# Patient Record
Sex: Female | Born: 1940
Health system: Southern US, Community
[De-identification: ages and names within clinical notes are randomized; demographics above are authoritative.]

## PROBLEM LIST (undated history)

## (undated) DIAGNOSIS — N189 Chronic kidney disease, unspecified: Secondary | ICD-10-CM

## (undated) DIAGNOSIS — I259 Chronic ischemic heart disease, unspecified: Secondary | ICD-10-CM

## (undated) DIAGNOSIS — M1990 Unspecified osteoarthritis, unspecified site: Secondary | ICD-10-CM

## (undated) DIAGNOSIS — I10 Essential (primary) hypertension: Secondary | ICD-10-CM

## (undated) DIAGNOSIS — E669 Obesity, unspecified: Secondary | ICD-10-CM

## (undated) DIAGNOSIS — D510 Vitamin B12 deficiency anemia due to intrinsic factor deficiency: Secondary | ICD-10-CM

## (undated) DIAGNOSIS — Z85820 Personal history of malignant melanoma of skin: Secondary | ICD-10-CM

## (undated) DIAGNOSIS — B9681 Helicobacter pylori [H. pylori] as the cause of diseases classified elsewhere: Secondary | ICD-10-CM

## (undated) DIAGNOSIS — B9689 Other specified bacterial agents as the cause of diseases classified elsewhere: Secondary | ICD-10-CM

## (undated) DIAGNOSIS — E1122 Type 2 diabetes mellitus with diabetic chronic kidney disease: Secondary | ICD-10-CM

## (undated) DIAGNOSIS — N250 Renal osteodystrophy: Secondary | ICD-10-CM

## (undated) DIAGNOSIS — K2960 Other gastritis without bleeding: Secondary | ICD-10-CM

## (undated) DIAGNOSIS — E559 Vitamin D deficiency, unspecified: Secondary | ICD-10-CM

## (undated) DIAGNOSIS — E7800 Pure hypercholesterolemia, unspecified: Secondary | ICD-10-CM

## (undated) HISTORY — DX: Vitamin D deficiency, unspecified: E55.9

## (undated) HISTORY — DX: Other gastritis without bleeding: K29.60

## (undated) HISTORY — DX: Obesity, unspecified: E66.9

## (undated) HISTORY — PX: ABDOMINAL HYSTERECTOMY: SHX81

## (undated) HISTORY — DX: Personal history of malignant melanoma of skin: Z85.820

## (undated) HISTORY — PX: BUNIONECTOMY: SHX129

## (undated) HISTORY — DX: Chronic ischemic heart disease, unspecified: I25.9

## (undated) HISTORY — PX: PARTIAL HYSTERECTOMY: SHX80

## (undated) HISTORY — DX: Chronic kidney disease, unspecified: N18.9

## (undated) HISTORY — DX: Helicobacter pylori (H. pylori) as the cause of diseases classified elsewhere: B96.81

## (undated) HISTORY — DX: Other specified bacterial agents as the cause of diseases classified elsewhere: B96.89

## (undated) HISTORY — PX: REPLACEMENT TOTAL KNEE: SUR1224

## (undated) HISTORY — DX: Vitamin B12 deficiency anemia due to intrinsic factor deficiency: D51.0

## (undated) HISTORY — DX: Unspecified osteoarthritis, unspecified site: M19.90

## (undated) HISTORY — PX: CATARACT EXTRACTION: SUR2

## (undated) HISTORY — DX: Pure hypercholesterolemia, unspecified: E78.00

---
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
## 1998-09-09 HISTORY — PX: BYPASS GRAFT: SHX909

## 1998-12-04 ENCOUNTER — Encounter: Payer: Self-pay | Admitting: *Deleted

## 1998-12-04 ENCOUNTER — Ambulatory Visit (HOSPITAL_COMMUNITY): Admission: RE | Admit: 1998-12-04 | Discharge: 1998-12-04 | Payer: Self-pay | Admitting: *Deleted

## 1999-06-29 ENCOUNTER — Inpatient Hospital Stay (HOSPITAL_COMMUNITY): Admission: EM | Admit: 1999-06-29 | Discharge: 1999-07-07 | Payer: Self-pay | Admitting: Cardiology

## 1999-06-30 ENCOUNTER — Encounter: Payer: Self-pay | Admitting: Cardiology

## 1999-07-02 ENCOUNTER — Encounter: Payer: Self-pay | Admitting: Cardiothoracic Surgery

## 1999-07-03 ENCOUNTER — Encounter: Payer: Self-pay | Admitting: Cardiothoracic Surgery

## 1999-07-04 ENCOUNTER — Encounter: Payer: Self-pay | Admitting: Cardiothoracic Surgery

## 1999-07-05 ENCOUNTER — Encounter: Payer: Self-pay | Admitting: Cardiothoracic Surgery

## 2007-02-24 ENCOUNTER — Encounter: Admission: RE | Admit: 2007-02-24 | Discharge: 2007-02-24 | Payer: Self-pay | Admitting: Family Medicine

## 2007-03-02 ENCOUNTER — Encounter: Admission: RE | Admit: 2007-03-02 | Discharge: 2007-03-02 | Payer: Self-pay | Admitting: Diagnostic Radiology

## 2009-09-09 HISTORY — PX: BACK SURGERY: SHX140

## 2010-09-30 ENCOUNTER — Encounter: Payer: Self-pay | Admitting: Diagnostic Radiology

## 2015-10-12 DIAGNOSIS — E109 Type 1 diabetes mellitus without complications: Secondary | ICD-10-CM | POA: Diagnosis not present

## 2015-10-20 DIAGNOSIS — E1165 Type 2 diabetes mellitus with hyperglycemia: Secondary | ICD-10-CM | POA: Diagnosis not present

## 2015-10-25 DIAGNOSIS — E113393 Type 2 diabetes mellitus with moderate nonproliferative diabetic retinopathy without macular edema, bilateral: Secondary | ICD-10-CM | POA: Diagnosis not present

## 2015-10-30 DIAGNOSIS — N189 Chronic kidney disease, unspecified: Secondary | ICD-10-CM | POA: Diagnosis not present

## 2015-10-30 DIAGNOSIS — E1165 Type 2 diabetes mellitus with hyperglycemia: Secondary | ICD-10-CM | POA: Diagnosis not present

## 2015-10-30 DIAGNOSIS — E7800 Pure hypercholesterolemia, unspecified: Secondary | ICD-10-CM | POA: Diagnosis not present

## 2015-10-30 DIAGNOSIS — M1990 Unspecified osteoarthritis, unspecified site: Secondary | ICD-10-CM | POA: Diagnosis not present

## 2015-12-20 DIAGNOSIS — H2512 Age-related nuclear cataract, left eye: Secondary | ICD-10-CM | POA: Diagnosis not present

## 2016-01-01 DIAGNOSIS — Z961 Presence of intraocular lens: Secondary | ICD-10-CM | POA: Diagnosis not present

## 2016-01-01 DIAGNOSIS — H25812 Combined forms of age-related cataract, left eye: Secondary | ICD-10-CM | POA: Diagnosis not present

## 2016-01-01 DIAGNOSIS — E119 Type 2 diabetes mellitus without complications: Secondary | ICD-10-CM | POA: Diagnosis not present

## 2016-01-01 DIAGNOSIS — H2512 Age-related nuclear cataract, left eye: Secondary | ICD-10-CM | POA: Diagnosis not present

## 2016-01-01 DIAGNOSIS — I10 Essential (primary) hypertension: Secondary | ICD-10-CM | POA: Diagnosis not present

## 2016-01-01 DIAGNOSIS — H52202 Unspecified astigmatism, left eye: Secondary | ICD-10-CM | POA: Diagnosis not present

## 2016-01-10 DIAGNOSIS — E113393 Type 2 diabetes mellitus with moderate nonproliferative diabetic retinopathy without macular edema, bilateral: Secondary | ICD-10-CM | POA: Diagnosis not present

## 2016-01-10 DIAGNOSIS — H268 Other specified cataract: Secondary | ICD-10-CM | POA: Diagnosis not present

## 2016-01-31 DIAGNOSIS — E119 Type 2 diabetes mellitus without complications: Secondary | ICD-10-CM | POA: Diagnosis not present

## 2016-01-31 DIAGNOSIS — Z9841 Cataract extraction status, right eye: Secondary | ICD-10-CM | POA: Diagnosis not present

## 2016-01-31 DIAGNOSIS — H25811 Combined forms of age-related cataract, right eye: Secondary | ICD-10-CM | POA: Diagnosis not present

## 2016-01-31 DIAGNOSIS — H2511 Age-related nuclear cataract, right eye: Secondary | ICD-10-CM | POA: Diagnosis not present

## 2016-01-31 DIAGNOSIS — Z9842 Cataract extraction status, left eye: Secondary | ICD-10-CM | POA: Diagnosis not present

## 2016-01-31 DIAGNOSIS — H25011 Cortical age-related cataract, right eye: Secondary | ICD-10-CM | POA: Diagnosis not present

## 2016-01-31 DIAGNOSIS — H25012 Cortical age-related cataract, left eye: Secondary | ICD-10-CM | POA: Diagnosis not present

## 2016-01-31 DIAGNOSIS — I10 Essential (primary) hypertension: Secondary | ICD-10-CM | POA: Diagnosis not present

## 2016-01-31 DIAGNOSIS — Z961 Presence of intraocular lens: Secondary | ICD-10-CM | POA: Diagnosis not present

## 2016-03-06 DIAGNOSIS — E113393 Type 2 diabetes mellitus with moderate nonproliferative diabetic retinopathy without macular edema, bilateral: Secondary | ICD-10-CM | POA: Diagnosis not present

## 2016-03-20 DIAGNOSIS — J019 Acute sinusitis, unspecified: Secondary | ICD-10-CM | POA: Diagnosis not present

## 2016-03-20 DIAGNOSIS — B9689 Other specified bacterial agents as the cause of diseases classified elsewhere: Secondary | ICD-10-CM | POA: Diagnosis not present

## 2016-03-20 DIAGNOSIS — J208 Acute bronchitis due to other specified organisms: Secondary | ICD-10-CM | POA: Diagnosis not present

## 2016-04-03 DIAGNOSIS — J189 Pneumonia, unspecified organism: Secondary | ICD-10-CM | POA: Diagnosis not present

## 2016-04-03 DIAGNOSIS — R05 Cough: Secondary | ICD-10-CM | POA: Diagnosis not present

## 2016-05-02 DIAGNOSIS — Z0000 Encounter for general adult medical examination without abnormal findings: Secondary | ICD-10-CM | POA: Diagnosis not present

## 2016-05-02 DIAGNOSIS — E1165 Type 2 diabetes mellitus with hyperglycemia: Secondary | ICD-10-CM | POA: Diagnosis not present

## 2016-05-02 DIAGNOSIS — Z9181 History of falling: Secondary | ICD-10-CM | POA: Diagnosis not present

## 2016-05-02 DIAGNOSIS — Z1389 Encounter for screening for other disorder: Secondary | ICD-10-CM | POA: Diagnosis not present

## 2016-05-02 DIAGNOSIS — I10 Essential (primary) hypertension: Secondary | ICD-10-CM | POA: Diagnosis not present

## 2016-05-02 DIAGNOSIS — N189 Chronic kidney disease, unspecified: Secondary | ICD-10-CM | POA: Diagnosis not present

## 2016-05-02 DIAGNOSIS — E785 Hyperlipidemia, unspecified: Secondary | ICD-10-CM | POA: Diagnosis not present

## 2016-05-02 DIAGNOSIS — Z780 Asymptomatic menopausal state: Secondary | ICD-10-CM | POA: Diagnosis not present

## 2016-05-17 DIAGNOSIS — I10 Essential (primary) hypertension: Secondary | ICD-10-CM | POA: Diagnosis not present

## 2016-05-22 DIAGNOSIS — M25519 Pain in unspecified shoulder: Secondary | ICD-10-CM | POA: Diagnosis not present

## 2016-06-14 DIAGNOSIS — I10 Essential (primary) hypertension: Secondary | ICD-10-CM | POA: Diagnosis not present

## 2016-06-14 DIAGNOSIS — E119 Type 2 diabetes mellitus without complications: Secondary | ICD-10-CM | POA: Diagnosis not present

## 2016-06-14 DIAGNOSIS — H1045 Other chronic allergic conjunctivitis: Secondary | ICD-10-CM | POA: Diagnosis not present

## 2016-06-14 DIAGNOSIS — Z794 Long term (current) use of insulin: Secondary | ICD-10-CM | POA: Diagnosis not present

## 2016-06-14 DIAGNOSIS — H3561 Retinal hemorrhage, right eye: Secondary | ICD-10-CM | POA: Diagnosis not present

## 2016-06-14 DIAGNOSIS — E113291 Type 2 diabetes mellitus with mild nonproliferative diabetic retinopathy without macular edema, right eye: Secondary | ICD-10-CM | POA: Diagnosis not present

## 2016-06-19 DIAGNOSIS — M25519 Pain in unspecified shoulder: Secondary | ICD-10-CM | POA: Diagnosis not present

## 2016-06-24 DIAGNOSIS — L578 Other skin changes due to chronic exposure to nonionizing radiation: Secondary | ICD-10-CM | POA: Diagnosis not present

## 2016-06-24 DIAGNOSIS — C44319 Basal cell carcinoma of skin of other parts of face: Secondary | ICD-10-CM | POA: Diagnosis not present

## 2016-06-24 DIAGNOSIS — L814 Other melanin hyperpigmentation: Secondary | ICD-10-CM | POA: Diagnosis not present

## 2016-06-24 DIAGNOSIS — D485 Neoplasm of uncertain behavior of skin: Secondary | ICD-10-CM | POA: Diagnosis not present

## 2016-06-25 DIAGNOSIS — C4359 Malignant melanoma of other part of trunk: Secondary | ICD-10-CM | POA: Diagnosis not present

## 2016-07-01 DIAGNOSIS — E1165 Type 2 diabetes mellitus with hyperglycemia: Secondary | ICD-10-CM | POA: Diagnosis not present

## 2016-07-01 DIAGNOSIS — I129 Hypertensive chronic kidney disease with stage 1 through stage 4 chronic kidney disease, or unspecified chronic kidney disease: Secondary | ICD-10-CM | POA: Diagnosis not present

## 2016-07-01 DIAGNOSIS — E1122 Type 2 diabetes mellitus with diabetic chronic kidney disease: Secondary | ICD-10-CM | POA: Diagnosis not present

## 2016-07-01 DIAGNOSIS — N281 Cyst of kidney, acquired: Secondary | ICD-10-CM | POA: Diagnosis not present

## 2016-07-01 DIAGNOSIS — Z23 Encounter for immunization: Secondary | ICD-10-CM | POA: Diagnosis not present

## 2016-07-01 DIAGNOSIS — N250 Renal osteodystrophy: Secondary | ICD-10-CM | POA: Diagnosis not present

## 2016-07-01 DIAGNOSIS — N183 Chronic kidney disease, stage 3 (moderate): Secondary | ICD-10-CM | POA: Diagnosis not present

## 2016-07-03 DIAGNOSIS — I10 Essential (primary) hypertension: Secondary | ICD-10-CM | POA: Diagnosis not present

## 2016-07-03 DIAGNOSIS — E1165 Type 2 diabetes mellitus with hyperglycemia: Secondary | ICD-10-CM | POA: Diagnosis not present

## 2016-07-03 DIAGNOSIS — E7800 Pure hypercholesterolemia, unspecified: Secondary | ICD-10-CM | POA: Diagnosis not present

## 2016-07-04 DIAGNOSIS — E109 Type 1 diabetes mellitus without complications: Secondary | ICD-10-CM | POA: Diagnosis not present

## 2016-07-09 DIAGNOSIS — C4359 Malignant melanoma of other part of trunk: Secondary | ICD-10-CM | POA: Diagnosis not present

## 2016-07-31 DIAGNOSIS — C44319 Basal cell carcinoma of skin of other parts of face: Secondary | ICD-10-CM | POA: Diagnosis not present

## 2016-08-05 DIAGNOSIS — E7800 Pure hypercholesterolemia, unspecified: Secondary | ICD-10-CM | POA: Diagnosis not present

## 2016-08-05 DIAGNOSIS — I10 Essential (primary) hypertension: Secondary | ICD-10-CM | POA: Diagnosis not present

## 2016-08-05 DIAGNOSIS — E1165 Type 2 diabetes mellitus with hyperglycemia: Secondary | ICD-10-CM | POA: Diagnosis not present

## 2016-09-16 DIAGNOSIS — H5203 Hypermetropia, bilateral: Secondary | ICD-10-CM | POA: Diagnosis not present

## 2016-09-16 DIAGNOSIS — Z961 Presence of intraocular lens: Secondary | ICD-10-CM | POA: Diagnosis not present

## 2016-09-16 DIAGNOSIS — H26492 Other secondary cataract, left eye: Secondary | ICD-10-CM | POA: Diagnosis not present

## 2016-09-16 DIAGNOSIS — Z794 Long term (current) use of insulin: Secondary | ICD-10-CM | POA: Diagnosis not present

## 2016-09-16 DIAGNOSIS — E113311 Type 2 diabetes mellitus with moderate nonproliferative diabetic retinopathy with macular edema, right eye: Secondary | ICD-10-CM | POA: Diagnosis not present

## 2016-09-16 DIAGNOSIS — H43813 Vitreous degeneration, bilateral: Secondary | ICD-10-CM | POA: Diagnosis not present

## 2016-09-16 DIAGNOSIS — E119 Type 2 diabetes mellitus without complications: Secondary | ICD-10-CM | POA: Diagnosis not present

## 2016-09-16 DIAGNOSIS — H43393 Other vitreous opacities, bilateral: Secondary | ICD-10-CM | POA: Diagnosis not present

## 2016-09-16 DIAGNOSIS — H52223 Regular astigmatism, bilateral: Secondary | ICD-10-CM | POA: Diagnosis not present

## 2016-09-16 DIAGNOSIS — H26491 Other secondary cataract, right eye: Secondary | ICD-10-CM | POA: Diagnosis not present

## 2016-09-23 DIAGNOSIS — I129 Hypertensive chronic kidney disease with stage 1 through stage 4 chronic kidney disease, or unspecified chronic kidney disease: Secondary | ICD-10-CM | POA: Diagnosis not present

## 2016-09-23 DIAGNOSIS — E1122 Type 2 diabetes mellitus with diabetic chronic kidney disease: Secondary | ICD-10-CM | POA: Diagnosis not present

## 2016-09-23 DIAGNOSIS — R609 Edema, unspecified: Secondary | ICD-10-CM | POA: Diagnosis not present

## 2016-09-23 DIAGNOSIS — N269 Renal sclerosis, unspecified: Secondary | ICD-10-CM | POA: Diagnosis not present

## 2016-09-23 DIAGNOSIS — E1121 Type 2 diabetes mellitus with diabetic nephropathy: Secondary | ICD-10-CM | POA: Diagnosis not present

## 2016-09-23 DIAGNOSIS — N250 Renal osteodystrophy: Secondary | ICD-10-CM | POA: Diagnosis not present

## 2016-09-23 DIAGNOSIS — R809 Proteinuria, unspecified: Secondary | ICD-10-CM | POA: Diagnosis not present

## 2016-09-23 DIAGNOSIS — N183 Chronic kidney disease, stage 3 (moderate): Secondary | ICD-10-CM | POA: Diagnosis not present

## 2016-09-25 DIAGNOSIS — D472 Monoclonal gammopathy: Secondary | ICD-10-CM | POA: Insufficient documentation

## 2016-09-25 HISTORY — DX: Monoclonal gammopathy: D47.2

## 2016-10-09 DIAGNOSIS — E1165 Type 2 diabetes mellitus with hyperglycemia: Secondary | ICD-10-CM | POA: Diagnosis not present

## 2016-10-09 DIAGNOSIS — E559 Vitamin D deficiency, unspecified: Secondary | ICD-10-CM | POA: Diagnosis not present

## 2016-10-09 DIAGNOSIS — E785 Hyperlipidemia, unspecified: Secondary | ICD-10-CM | POA: Diagnosis not present

## 2016-10-09 DIAGNOSIS — Z79899 Other long term (current) drug therapy: Secondary | ICD-10-CM | POA: Diagnosis not present

## 2016-10-09 DIAGNOSIS — Z01419 Encounter for gynecological examination (general) (routine) without abnormal findings: Secondary | ICD-10-CM | POA: Diagnosis not present

## 2016-11-14 DIAGNOSIS — D485 Neoplasm of uncertain behavior of skin: Secondary | ICD-10-CM | POA: Diagnosis not present

## 2016-11-14 DIAGNOSIS — L989 Disorder of the skin and subcutaneous tissue, unspecified: Secondary | ICD-10-CM | POA: Diagnosis not present

## 2016-11-22 DIAGNOSIS — Z1231 Encounter for screening mammogram for malignant neoplasm of breast: Secondary | ICD-10-CM | POA: Diagnosis not present

## 2017-01-02 DIAGNOSIS — D485 Neoplasm of uncertain behavior of skin: Secondary | ICD-10-CM | POA: Diagnosis not present

## 2017-01-31 DIAGNOSIS — E1165 Type 2 diabetes mellitus with hyperglycemia: Secondary | ICD-10-CM | POA: Diagnosis not present

## 2017-02-10 DIAGNOSIS — Z6836 Body mass index (BMI) 36.0-36.9, adult: Secondary | ICD-10-CM | POA: Diagnosis not present

## 2017-02-10 DIAGNOSIS — I10 Essential (primary) hypertension: Secondary | ICD-10-CM | POA: Diagnosis not present

## 2017-02-10 DIAGNOSIS — E7800 Pure hypercholesterolemia, unspecified: Secondary | ICD-10-CM | POA: Diagnosis not present

## 2017-02-10 DIAGNOSIS — E1165 Type 2 diabetes mellitus with hyperglycemia: Secondary | ICD-10-CM | POA: Diagnosis not present

## 2017-03-14 DIAGNOSIS — E109 Type 1 diabetes mellitus without complications: Secondary | ICD-10-CM | POA: Diagnosis not present

## 2017-03-24 DIAGNOSIS — I129 Hypertensive chronic kidney disease with stage 1 through stage 4 chronic kidney disease, or unspecified chronic kidney disease: Secondary | ICD-10-CM | POA: Diagnosis not present

## 2017-03-24 DIAGNOSIS — N183 Chronic kidney disease, stage 3 (moderate): Secondary | ICD-10-CM | POA: Diagnosis not present

## 2017-03-24 DIAGNOSIS — N250 Renal osteodystrophy: Secondary | ICD-10-CM | POA: Diagnosis not present

## 2017-03-24 DIAGNOSIS — Q602 Renal agenesis, unspecified: Secondary | ICD-10-CM | POA: Diagnosis not present

## 2017-03-28 DIAGNOSIS — D472 Monoclonal gammopathy: Secondary | ICD-10-CM | POA: Diagnosis not present

## 2017-04-02 DIAGNOSIS — C44729 Squamous cell carcinoma of skin of left lower limb, including hip: Secondary | ICD-10-CM | POA: Diagnosis not present

## 2017-04-21 DIAGNOSIS — R112 Nausea with vomiting, unspecified: Secondary | ICD-10-CM | POA: Diagnosis not present

## 2017-04-21 DIAGNOSIS — K219 Gastro-esophageal reflux disease without esophagitis: Secondary | ICD-10-CM | POA: Diagnosis not present

## 2017-04-21 DIAGNOSIS — K5730 Diverticulosis of large intestine without perforation or abscess without bleeding: Secondary | ICD-10-CM | POA: Diagnosis not present

## 2017-04-21 DIAGNOSIS — K222 Esophageal obstruction: Secondary | ICD-10-CM | POA: Diagnosis not present

## 2017-05-13 DIAGNOSIS — Z23 Encounter for immunization: Secondary | ICD-10-CM | POA: Diagnosis not present

## 2017-05-14 DIAGNOSIS — Z1389 Encounter for screening for other disorder: Secondary | ICD-10-CM | POA: Diagnosis not present

## 2017-05-14 DIAGNOSIS — E1165 Type 2 diabetes mellitus with hyperglycemia: Secondary | ICD-10-CM | POA: Diagnosis not present

## 2017-05-14 DIAGNOSIS — E7800 Pure hypercholesterolemia, unspecified: Secondary | ICD-10-CM | POA: Diagnosis not present

## 2017-05-14 DIAGNOSIS — Z9181 History of falling: Secondary | ICD-10-CM | POA: Diagnosis not present

## 2017-08-13 DIAGNOSIS — E1165 Type 2 diabetes mellitus with hyperglycemia: Secondary | ICD-10-CM | POA: Diagnosis not present

## 2017-08-20 DIAGNOSIS — I10 Essential (primary) hypertension: Secondary | ICD-10-CM | POA: Diagnosis not present

## 2017-08-20 DIAGNOSIS — E1165 Type 2 diabetes mellitus with hyperglycemia: Secondary | ICD-10-CM | POA: Diagnosis not present

## 2017-08-20 DIAGNOSIS — Z6835 Body mass index (BMI) 35.0-35.9, adult: Secondary | ICD-10-CM | POA: Diagnosis not present

## 2017-08-20 DIAGNOSIS — Z1339 Encounter for screening examination for other mental health and behavioral disorders: Secondary | ICD-10-CM | POA: Diagnosis not present

## 2017-09-29 DIAGNOSIS — Q602 Renal agenesis, unspecified: Secondary | ICD-10-CM | POA: Diagnosis not present

## 2017-09-29 DIAGNOSIS — D472 Monoclonal gammopathy: Secondary | ICD-10-CM | POA: Diagnosis not present

## 2017-09-29 DIAGNOSIS — I129 Hypertensive chronic kidney disease with stage 1 through stage 4 chronic kidney disease, or unspecified chronic kidney disease: Secondary | ICD-10-CM | POA: Diagnosis not present

## 2017-09-29 DIAGNOSIS — N183 Chronic kidney disease, stage 3 (moderate): Secondary | ICD-10-CM | POA: Diagnosis not present

## 2017-09-29 DIAGNOSIS — N250 Renal osteodystrophy: Secondary | ICD-10-CM | POA: Diagnosis not present

## 2017-10-01 DIAGNOSIS — D0471 Carcinoma in situ of skin of right lower limb, including hip: Secondary | ICD-10-CM | POA: Diagnosis not present

## 2017-10-10 DIAGNOSIS — E1165 Type 2 diabetes mellitus with hyperglycemia: Secondary | ICD-10-CM | POA: Diagnosis not present

## 2017-10-16 DIAGNOSIS — D0471 Carcinoma in situ of skin of right lower limb, including hip: Secondary | ICD-10-CM | POA: Diagnosis not present

## 2017-10-22 DIAGNOSIS — Z6835 Body mass index (BMI) 35.0-35.9, adult: Secondary | ICD-10-CM | POA: Diagnosis not present

## 2017-10-22 DIAGNOSIS — D539 Nutritional anemia, unspecified: Secondary | ICD-10-CM | POA: Diagnosis not present

## 2017-10-22 DIAGNOSIS — Z01419 Encounter for gynecological examination (general) (routine) without abnormal findings: Secondary | ICD-10-CM | POA: Diagnosis not present

## 2017-10-22 DIAGNOSIS — E1165 Type 2 diabetes mellitus with hyperglycemia: Secondary | ICD-10-CM | POA: Diagnosis not present

## 2017-10-29 DIAGNOSIS — N183 Chronic kidney disease, stage 3 (moderate): Secondary | ICD-10-CM | POA: Diagnosis not present

## 2017-11-10 DIAGNOSIS — I129 Hypertensive chronic kidney disease with stage 1 through stage 4 chronic kidney disease, or unspecified chronic kidney disease: Secondary | ICD-10-CM | POA: Diagnosis not present

## 2017-11-10 DIAGNOSIS — E1122 Type 2 diabetes mellitus with diabetic chronic kidney disease: Secondary | ICD-10-CM | POA: Diagnosis not present

## 2017-11-10 DIAGNOSIS — N183 Chronic kidney disease, stage 3 (moderate): Secondary | ICD-10-CM | POA: Diagnosis not present

## 2017-11-12 DIAGNOSIS — Z794 Long term (current) use of insulin: Secondary | ICD-10-CM | POA: Diagnosis not present

## 2017-11-12 DIAGNOSIS — H26491 Other secondary cataract, right eye: Secondary | ICD-10-CM | POA: Diagnosis not present

## 2017-11-12 DIAGNOSIS — E113311 Type 2 diabetes mellitus with moderate nonproliferative diabetic retinopathy with macular edema, right eye: Secondary | ICD-10-CM | POA: Diagnosis not present

## 2017-11-12 DIAGNOSIS — E119 Type 2 diabetes mellitus without complications: Secondary | ICD-10-CM | POA: Diagnosis not present

## 2018-01-12 DIAGNOSIS — E1165 Type 2 diabetes mellitus with hyperglycemia: Secondary | ICD-10-CM | POA: Diagnosis not present

## 2018-01-19 DIAGNOSIS — E1165 Type 2 diabetes mellitus with hyperglycemia: Secondary | ICD-10-CM | POA: Diagnosis not present

## 2018-01-19 DIAGNOSIS — I10 Essential (primary) hypertension: Secondary | ICD-10-CM | POA: Diagnosis not present

## 2018-01-19 DIAGNOSIS — E785 Hyperlipidemia, unspecified: Secondary | ICD-10-CM | POA: Diagnosis not present

## 2018-01-28 DIAGNOSIS — E109 Type 1 diabetes mellitus without complications: Secondary | ICD-10-CM | POA: Diagnosis not present

## 2018-02-27 ENCOUNTER — Other Ambulatory Visit: Payer: Self-pay

## 2018-03-02 ENCOUNTER — Other Ambulatory Visit: Payer: Self-pay

## 2018-03-23 DIAGNOSIS — E785 Hyperlipidemia, unspecified: Secondary | ICD-10-CM | POA: Diagnosis not present

## 2018-03-23 DIAGNOSIS — I10 Essential (primary) hypertension: Secondary | ICD-10-CM | POA: Diagnosis not present

## 2018-03-23 DIAGNOSIS — E1165 Type 2 diabetes mellitus with hyperglycemia: Secondary | ICD-10-CM | POA: Diagnosis not present

## 2018-03-23 DIAGNOSIS — Z6835 Body mass index (BMI) 35.0-35.9, adult: Secondary | ICD-10-CM | POA: Diagnosis not present

## 2018-03-24 ENCOUNTER — Ambulatory Visit: Payer: Medicare Other | Admitting: Cardiology

## 2018-04-15 ENCOUNTER — Ambulatory Visit: Payer: Medicare Other | Admitting: Cardiology

## 2018-06-08 DIAGNOSIS — I129 Hypertensive chronic kidney disease with stage 1 through stage 4 chronic kidney disease, or unspecified chronic kidney disease: Secondary | ICD-10-CM | POA: Diagnosis not present

## 2018-06-08 DIAGNOSIS — N183 Chronic kidney disease, stage 3 (moderate): Secondary | ICD-10-CM | POA: Diagnosis not present

## 2018-06-08 DIAGNOSIS — N250 Renal osteodystrophy: Secondary | ICD-10-CM | POA: Diagnosis not present

## 2018-06-12 DIAGNOSIS — Z1331 Encounter for screening for depression: Secondary | ICD-10-CM | POA: Diagnosis not present

## 2018-06-12 DIAGNOSIS — Z9181 History of falling: Secondary | ICD-10-CM | POA: Diagnosis not present

## 2018-06-12 DIAGNOSIS — Z23 Encounter for immunization: Secondary | ICD-10-CM | POA: Diagnosis not present

## 2018-06-12 DIAGNOSIS — E1165 Type 2 diabetes mellitus with hyperglycemia: Secondary | ICD-10-CM | POA: Diagnosis not present

## 2018-09-04 DIAGNOSIS — J0100 Acute maxillary sinusitis, unspecified: Secondary | ICD-10-CM | POA: Diagnosis not present

## 2018-09-04 DIAGNOSIS — R05 Cough: Secondary | ICD-10-CM | POA: Diagnosis not present

## 2018-09-07 DIAGNOSIS — E109 Type 1 diabetes mellitus without complications: Secondary | ICD-10-CM | POA: Diagnosis not present

## 2018-09-11 DIAGNOSIS — Z6834 Body mass index (BMI) 34.0-34.9, adult: Secondary | ICD-10-CM | POA: Diagnosis not present

## 2018-09-11 DIAGNOSIS — E7800 Pure hypercholesterolemia, unspecified: Secondary | ICD-10-CM | POA: Diagnosis not present

## 2018-09-11 DIAGNOSIS — I10 Essential (primary) hypertension: Secondary | ICD-10-CM | POA: Diagnosis not present

## 2018-09-11 DIAGNOSIS — E1165 Type 2 diabetes mellitus with hyperglycemia: Secondary | ICD-10-CM | POA: Diagnosis not present

## 2018-09-29 DIAGNOSIS — N189 Chronic kidney disease, unspecified: Secondary | ICD-10-CM | POA: Diagnosis not present

## 2018-09-29 DIAGNOSIS — D472 Monoclonal gammopathy: Secondary | ICD-10-CM | POA: Diagnosis not present

## 2018-12-01 DIAGNOSIS — N250 Renal osteodystrophy: Secondary | ICD-10-CM | POA: Insufficient documentation

## 2018-12-01 DIAGNOSIS — I10 Essential (primary) hypertension: Secondary | ICD-10-CM

## 2018-12-01 DIAGNOSIS — N183 Chronic kidney disease, stage 3 unspecified: Secondary | ICD-10-CM | POA: Insufficient documentation

## 2018-12-01 DIAGNOSIS — E1122 Type 2 diabetes mellitus with diabetic chronic kidney disease: Secondary | ICD-10-CM

## 2018-12-01 HISTORY — DX: Essential (primary) hypertension: I10

## 2018-12-01 HISTORY — DX: Renal osteodystrophy: N25.0

## 2018-12-01 HISTORY — DX: Chronic kidney disease, stage 3 unspecified: N18.30

## 2018-12-01 HISTORY — DX: Type 2 diabetes mellitus with diabetic chronic kidney disease: E11.22

## 2019-03-16 DIAGNOSIS — Z6834 Body mass index (BMI) 34.0-34.9, adult: Secondary | ICD-10-CM | POA: Diagnosis not present

## 2019-03-16 DIAGNOSIS — N189 Chronic kidney disease, unspecified: Secondary | ICD-10-CM | POA: Diagnosis not present

## 2019-03-16 DIAGNOSIS — E1165 Type 2 diabetes mellitus with hyperglycemia: Secondary | ICD-10-CM | POA: Diagnosis not present

## 2019-03-29 DIAGNOSIS — E109 Type 1 diabetes mellitus without complications: Secondary | ICD-10-CM | POA: Diagnosis not present

## 2019-04-01 DIAGNOSIS — M25511 Pain in right shoulder: Secondary | ICD-10-CM | POA: Diagnosis not present

## 2019-04-08 DIAGNOSIS — M19011 Primary osteoarthritis, right shoulder: Secondary | ICD-10-CM | POA: Diagnosis not present

## 2019-04-08 DIAGNOSIS — M65811 Other synovitis and tenosynovitis, right shoulder: Secondary | ICD-10-CM | POA: Diagnosis not present

## 2019-04-08 DIAGNOSIS — M25511 Pain in right shoulder: Secondary | ICD-10-CM | POA: Diagnosis not present

## 2019-04-14 DIAGNOSIS — M75101 Unspecified rotator cuff tear or rupture of right shoulder, not specified as traumatic: Secondary | ICD-10-CM | POA: Diagnosis not present

## 2019-04-14 DIAGNOSIS — M25511 Pain in right shoulder: Secondary | ICD-10-CM | POA: Diagnosis not present

## 2019-04-26 DIAGNOSIS — M19019 Primary osteoarthritis, unspecified shoulder: Secondary | ICD-10-CM | POA: Diagnosis not present

## 2019-04-26 DIAGNOSIS — M19011 Primary osteoarthritis, right shoulder: Secondary | ICD-10-CM | POA: Diagnosis not present

## 2019-04-26 DIAGNOSIS — M75121 Complete rotator cuff tear or rupture of right shoulder, not specified as traumatic: Secondary | ICD-10-CM | POA: Diagnosis not present

## 2019-04-26 DIAGNOSIS — M25511 Pain in right shoulder: Secondary | ICD-10-CM | POA: Diagnosis not present

## 2019-05-03 DIAGNOSIS — M25511 Pain in right shoulder: Secondary | ICD-10-CM | POA: Diagnosis not present

## 2019-05-03 DIAGNOSIS — R600 Localized edema: Secondary | ICD-10-CM | POA: Diagnosis not present

## 2019-05-03 DIAGNOSIS — Z6834 Body mass index (BMI) 34.0-34.9, adult: Secondary | ICD-10-CM | POA: Diagnosis not present

## 2019-05-03 DIAGNOSIS — L570 Actinic keratosis: Secondary | ICD-10-CM | POA: Diagnosis not present

## 2019-05-10 DIAGNOSIS — M810 Age-related osteoporosis without current pathological fracture: Secondary | ICD-10-CM | POA: Diagnosis not present

## 2019-05-10 DIAGNOSIS — Z01419 Encounter for gynecological examination (general) (routine) without abnormal findings: Secondary | ICD-10-CM | POA: Diagnosis not present

## 2019-05-10 DIAGNOSIS — Z6834 Body mass index (BMI) 34.0-34.9, adult: Secondary | ICD-10-CM | POA: Diagnosis not present

## 2019-05-18 ENCOUNTER — Other Ambulatory Visit: Payer: Self-pay | Admitting: Orthopedic Surgery

## 2019-05-18 DIAGNOSIS — M75121 Complete rotator cuff tear or rupture of right shoulder, not specified as traumatic: Secondary | ICD-10-CM | POA: Diagnosis not present

## 2019-05-18 DIAGNOSIS — M25511 Pain in right shoulder: Secondary | ICD-10-CM | POA: Diagnosis not present

## 2019-05-19 ENCOUNTER — Other Ambulatory Visit: Payer: Self-pay | Admitting: Orthopedic Surgery

## 2019-05-19 DIAGNOSIS — M25511 Pain in right shoulder: Secondary | ICD-10-CM

## 2019-05-20 DIAGNOSIS — D510 Vitamin B12 deficiency anemia due to intrinsic factor deficiency: Secondary | ICD-10-CM | POA: Diagnosis not present

## 2019-05-25 ENCOUNTER — Encounter: Payer: Self-pay | Admitting: Cardiology

## 2019-05-25 ENCOUNTER — Encounter: Payer: Self-pay | Admitting: *Deleted

## 2019-05-25 DIAGNOSIS — E7800 Pure hypercholesterolemia, unspecified: Secondary | ICD-10-CM | POA: Insufficient documentation

## 2019-05-25 DIAGNOSIS — M1990 Unspecified osteoarthritis, unspecified site: Secondary | ICD-10-CM | POA: Insufficient documentation

## 2019-05-25 DIAGNOSIS — E559 Vitamin D deficiency, unspecified: Secondary | ICD-10-CM | POA: Insufficient documentation

## 2019-05-25 DIAGNOSIS — I259 Chronic ischemic heart disease, unspecified: Secondary | ICD-10-CM | POA: Insufficient documentation

## 2019-05-25 DIAGNOSIS — B9689 Other specified bacterial agents as the cause of diseases classified elsewhere: Secondary | ICD-10-CM | POA: Insufficient documentation

## 2019-05-25 DIAGNOSIS — D510 Vitamin B12 deficiency anemia due to intrinsic factor deficiency: Secondary | ICD-10-CM | POA: Insufficient documentation

## 2019-05-25 DIAGNOSIS — Z85820 Personal history of malignant melanoma of skin: Secondary | ICD-10-CM | POA: Insufficient documentation

## 2019-05-25 DIAGNOSIS — N189 Chronic kidney disease, unspecified: Secondary | ICD-10-CM | POA: Insufficient documentation

## 2019-05-25 DIAGNOSIS — B9681 Helicobacter pylori [H. pylori] as the cause of diseases classified elsewhere: Secondary | ICD-10-CM | POA: Insufficient documentation

## 2019-05-25 DIAGNOSIS — E669 Obesity, unspecified: Secondary | ICD-10-CM | POA: Insufficient documentation

## 2019-05-26 ENCOUNTER — Encounter: Payer: Self-pay | Admitting: Cardiology

## 2019-05-26 ENCOUNTER — Other Ambulatory Visit: Payer: Self-pay

## 2019-05-26 ENCOUNTER — Ambulatory Visit (INDEPENDENT_AMBULATORY_CARE_PROVIDER_SITE_OTHER): Payer: Medicare Other | Admitting: Cardiology

## 2019-05-26 VITALS — BP 130/78 | HR 78 | Ht 63.0 in | Wt 192.0 lb

## 2019-05-26 DIAGNOSIS — E7800 Pure hypercholesterolemia, unspecified: Secondary | ICD-10-CM

## 2019-05-26 DIAGNOSIS — Z951 Presence of aortocoronary bypass graft: Secondary | ICD-10-CM

## 2019-05-26 DIAGNOSIS — E088 Diabetes mellitus due to underlying condition with unspecified complications: Secondary | ICD-10-CM

## 2019-05-26 DIAGNOSIS — E119 Type 2 diabetes mellitus without complications: Secondary | ICD-10-CM

## 2019-05-26 DIAGNOSIS — I2510 Atherosclerotic heart disease of native coronary artery without angina pectoris: Secondary | ICD-10-CM

## 2019-05-26 DIAGNOSIS — I10 Essential (primary) hypertension: Secondary | ICD-10-CM | POA: Diagnosis not present

## 2019-05-26 DIAGNOSIS — Z01818 Encounter for other preprocedural examination: Secondary | ICD-10-CM

## 2019-05-26 HISTORY — DX: Type 2 diabetes mellitus without complications: E11.9

## 2019-05-26 HISTORY — DX: Atherosclerotic heart disease of native coronary artery without angina pectoris: I25.10

## 2019-05-26 HISTORY — DX: Diabetes mellitus due to underlying condition with unspecified complications: E08.8

## 2019-05-26 HISTORY — DX: Presence of aortocoronary bypass graft: Z95.1

## 2019-05-26 MED ORDER — NITROGLYCERIN 0.4 MG SL SUBL: 0.4000 mg | SUBLINGUAL_TABLET | SUBLINGUAL | 3 refills | Status: DC | PRN

## 2019-05-26 NOTE — Patient Instructions (Addendum)
Medication Instructions:  Your physician has recommended you make the following change in your medication:   START taking aspirin 81 mg (1 tablet) once daily  START taking nitroglycerin as needed for chest pain, When having chest pain, stop what you are doing and sit down. Take 1 nitro, wait 5 minutes. Still having chest pain, take 1 nitro, wait 5 minutes. Still having chest pain, take 1 nitro, dial 911. Total of 3 nitro in 15 minutes.  If you need a refill on your cardiac medications before your next appointment, please call your pharmacy.   Lab work: Your physician recommends that you return FASTING for lipid and hepatic to be drawn.  If you have labs (blood work) drawn today and your tests are completely normal, you will receive your results only by: Marland Kitchen MyChart Message (if you have MyChart) OR . A paper copy in the mail If you have any lab test that is abnormal or we need to change your treatment, we will call you to review the results.  Testing/Procedures: You had an EKG performed today  Your physician has requested that you have an echocardiogram. Echocardiography is a painless test that uses sound waves to create images of your heart. It provides your doctor with information about the size and shape of your heart and how well your heart's chambers and valves are working. This procedure takes approximately one hour. There are no restrictions for this procedure.    Follow-Up: At [facility], you and your health needs are our priority.  As part of our continuing mission to provide you with exceptional heart care, we have created designated Provider Care Teams.  These Care Teams include your primary Cardiologist (physician) and Advanced Practice Providers (APPs -  Physician Assistants and Nurse Practitioners) who all work together to provide you with the care you need, when you need it. You will need a follow up appointment in 6 months.    Any Other Special Instructions Will Be Listed  Below Nitroglycerin sublingual tablets What is this medicine? NITROGLYCERIN (nye troe GLI ser in) is a type of vasodilator. It relaxes blood vessels, increasing the blood and oxygen supply to your heart. This medicine is used to relieve chest pain caused by angina. It is also used to prevent chest pain before activities like climbing stairs, going outdoors in cold weather, or sexual activity. This medicine may be used for other purposes; ask your health care provider or pharmacist if you have questions. COMMON BRAND NAME(S): Nitroquick, Nitrostat, Nitrotab What should I tell my health care provider before I take this medicine? They need to know if you have any of these conditions:  anemia  head injury, recent stroke, or bleeding in the brain  liver disease  previous heart attack  an unusual or allergic reaction to nitroglycerin, other medicines, foods, dyes, or preservatives  pregnant or trying to get pregnant  breast-feeding How should I use this medicine? Take this medicine by mouth as needed. At the first sign of an angina attack (chest pain or tightness) place one tablet under your tongue. You can also take this medicine 5 to 10 minutes before an event likely to produce chest pain. Follow the directions on the prescription label. Let the tablet dissolve under the tongue. Do not swallow whole. Replace the dose if you accidentally swallow it. It will help if your mouth is not dry. Saliva around the tablet will help it to dissolve more quickly. Do not eat or drink, smoke or chew tobacco while  a tablet is dissolving. If you are not better within 5 minutes after taking ONE dose of nitroglycerin, call 9-1-1 immediately to seek emergency medical care. Do not take more than 3 nitroglycerin tablets over 15 minutes. If you take this medicine often to relieve symptoms of angina, your doctor or health care professional may provide you with different instructions to manage your symptoms. If symptoms  do not go away after following these instructions, it is important to call 9-1-1 immediately. Do not take more than 3 nitroglycerin tablets over 15 minutes. Talk to your pediatrician regarding the use of this medicine in children. Special care may be needed. Overdosage: If you think you have taken too much of this medicine contact a poison control center or emergency room at once. NOTE: This medicine is only for you. Do not share this medicine with others. What if I miss a dose? This does not apply. This medicine is only used as needed. What may interact with this medicine? Do not take this medicine with any of the following medications:  certain migraine medicines like ergotamine and dihydroergotamine (DHE)  medicines used to treat erectile dysfunction like sildenafil, tadalafil, and vardenafil  riociguat This medicine may also interact with the following medications:  alteplase  aspirin  heparin  medicines for high blood pressure  medicines for mental depression  other medicines used to treat angina  phenothiazines like chlorpromazine, mesoridazine, prochlorperazine, thioridazine This list may not describe all possible interactions. Give your health care provider a list of all the medicines, herbs, non-prescription drugs, or dietary supplements you use. Also tell them if you smoke, drink alcohol, or use illegal drugs. Some items may interact with your medicine. What should I watch for while using this medicine? Tell your doctor or health care professional if you feel your medicine is no longer working. Keep this medicine with you at all times. Sit or lie down when you take your medicine to prevent falling if you feel dizzy or faint after using it. Try to remain calm. This will help you to feel better faster. If you feel dizzy, take several deep breaths and lie down with your feet propped up, or bend forward with your head resting between your knees. You may get drowsy or dizzy. Do  not drive, use machinery, or do anything that needs mental alertness until you know how this drug affects you. Do not stand or sit up quickly, especially if you are an older patient. This reduces the risk of dizzy or fainting spells. Alcohol can make you more drowsy and dizzy. Avoid alcoholic drinks. Do not treat yourself for coughs, colds, or pain while you are taking this medicine without asking your doctor or health care professional for advice. Some ingredients may increase your blood pressure. What side effects may I notice from receiving this medicine? Side effects that you should report to your doctor or health care professional as soon as possible:  blurred vision  dry mouth  skin rash  sweating  the feeling of extreme pressure in the head  unusually weak or tired Side effects that usually do not require medical attention (report to your doctor or health care professional if they continue or are bothersome):  flushing of the face or neck  headache  irregular heartbeat, palpitations  nausea, vomiting This list may not describe all possible side effects. Call your doctor for medical advice about side effects. You may report side effects to FDA at 1-800-FDA-1088. Where should I keep my medicine? Keep out  of the reach of children. Store at room temperature between 20 and 25 degrees C (68 and 77 degrees F). Store in [facility] [employer]. Protect from light and moisture. Keep tightly closed. Throw away any unused medicine after the expiration date. NOTE: This sheet is a summary. It may not cover all possible information. If you have questions about this medicine, talk to your doctor, pharmacist, or health care provider.  2020 Elsevier/Gold Standard (2013-06-24 17:57:36)  Aspirin and Your Heart  Aspirin is a medicine that prevents the cells in the blood that are used for clotting, called platelets, from sticking together. Aspirin can be used to help reduce the risk of blood clots,  heart attacks, and other heart-related problems. Can I take aspirin? Your health care provider will help you determine whether it is safe and beneficial for you to take aspirin daily. Taking aspirin daily may be helpful if you:  Have had a heart attack or chest pain.  Are at risk for a heart attack.  Have undergone open-heart surgery, such as coronary artery bypass surgery (CABG).  Have had coronary angioplasty or a stent.  Have had certain types of stroke or transient ischemic attack (TIA).  Have peripheral artery disease (PAD).  Have chronic heart rhythm problems such as atrial fibrillation and cannot take an anticoagulant.  Have valve disease or have had surgery on a valve. What are the risks? Daily use of aspirin can cause side effects. Some of these include:  Bleeding. Bleeding problems can be minor or serious. An example of a minor problem is a cut that does not stop bleeding. An example of a more serious problem is stomach bleeding or, rarely, bleeding into the brain. Your risk of bleeding is increased if you are also taking non-steroidal anti-inflammatory drugs (NSAIDs).  Increased bruising.  Upset stomach.  An allergic reaction. People who have nasal polyps have an increased risk of developing an aspirin allergy. General guidelines  Take aspirin only as told by your health care provider. Make sure that you understand how much you should take and what form you should take. The two forms of aspirin are: ? Non-enteric-coated.This type of aspirin does not have a coating and is absorbed quickly. This type of aspirin also comes in a chewable form. ? Enteric-coated. This type of aspirin has a coating that releases the medicine very slowly. Enteric-coated aspirin might cause less stomach upset than non-enteric-coated aspirin. This type of aspirin should not be chewed or crushed.  Limit alcohol intake to no more than 1 drink a day for nonpregnant women and 2 drinks a day for men.  Drinking alcohol increases your risk of bleeding. One drink equals 12 oz of beer, 5 oz of wine, or 1 oz of hard liquor. Contact a health care provider if you:  Have unusual bleeding or bruising.  Have stomach pain or nausea.  Have ringing in your ears.  Have an allergic reaction that causes: ? Hives. ? Itchy skin. ? Swelling of the lips, tongue, or face. Get help right away if you:  Notice that your bowel movements are bloody, dark red, or black in color.  Vomit or cough up blood.  Have blood in your urine.  Cough, have noisy breathing (wheeze), or feel short of breath.  Have chest pain, especially if the pain spreads to the arms, back, neck, or jaw.  Have a severe headache, or a headache with confusion, or dizziness. These symptoms may represent a serious problem that is an emergency. Do not wait  to see if the symptoms will go away. Get medical help right away. Call your local emergency services (911 in the [place] Do not drive yourself to the hospital. Summary  Aspirin can be used to help reduce the risk of blood clots, heart attacks, and other heart-related problems.  Daily use of aspirin can increase your risk of side effects. Your health care provider will help you determine whether it is safe and beneficial for you to take aspirin daily.  Take aspirin only as told by your health care provider. Make sure that you understand how much you can take and what form you can take. This information is not intended to replace advice given to you by your health care provider. Make sure you discuss any questions you have with your health care provider. Document Released: 08/08/2008 Document Revised: 06/26/2017 Document Reviewed: 06/26/2017 Elsevier Patient Education  [place].  Echocardiogram An echocardiogram is a procedure that uses painless sound waves (ultrasound) to produce an image of the heart. Images from an echocardiogram can provide important information about:   Signs of coronary artery disease (CAD).  Aneurysm detection. An aneurysm is a weak or damaged part of an artery wall that bulges out from the normal force of blood pumping through the body.  Heart size and shape. Changes in the size or shape of the heart can be associated with certain conditions, including heart failure, aneurysm, and CAD.  Heart muscle function.  Heart valve function.  Signs of a past heart attack.  Fluid buildup around the heart.  Thickening of the heart muscle.  A tumor or infectious growth around the heart valves. Tell a health care provider about:  Any allergies you have.  All medicines you are taking, including vitamins, herbs, eye drops, creams, and over-the-counter medicines.  Any blood disorders you have.  Any surgeries you have had.  Any medical conditions you have.  Whether you are pregnant or may be pregnant. What are the risks? Generally, this is a safe procedure. However, problems may occur, including:  Allergic reaction to dye (contrast) that may be used during the procedure. What happens before the procedure? No specific preparation is needed. You may eat and drink normally. What happens during the procedure?   An IV tube may be inserted into one of your veins.  You may receive contrast through this tube. A contrast is an injection that improves the quality of the pictures from your heart.  A gel will be applied to your chest.  A wand-like tool (transducer) will be moved over your chest. The gel will help to transmit the sound waves from the transducer.  The sound waves will harmlessly bounce off of your heart to allow the heart images to be captured in real-time motion. The images will be recorded on a computer. The procedure may vary among health care providers and hospitals. What happens after the procedure?  You may return to your normal, everyday life, including diet, activities, and medicines, unless your health care provider  tells you not to do that. Summary  An echocardiogram is a procedure that uses painless sound waves (ultrasound) to produce an image of the heart.  Images from an echocardiogram can provide important information about the size and shape of your heart, heart muscle function, heart valve function, and fluid buildup around your heart.  You do not need to do anything to prepare before this procedure. You may eat and drink normally.  After the echocardiogram is completed, you may return to  your normal, everyday life, unless your health care provider tells you not to do that. This information is not intended to replace advice given to you by your health care provider. Make sure you discuss any questions you have with your health care provider. Document Released: 08/23/2000 Document Revised: 12/17/2018 Document Reviewed: 09/28/2016 Elsevier Patient Education  2020 [employer].

## 2019-05-26 NOTE — Progress Notes (Signed)
Cardiology Office Note:    Date:  05/26/2019   ID:  Alyssa Cooper, DOB June 27, 1941, MRN [id number]  PCP:  Angelina Sheriff, MD  Cardiologist:  Jenean Lindau, MD   Referring MD: Angelina Sheriff, MD    ASSESSMENT:    1. Pure hypercholesterolemia   2. Essential hypertension   3. Diabetes mellitus due to underlying condition with unspecified complications ([place])   4. Coronary artery disease involving native coronary artery of native heart without angina pectoris   5. Hx of CABG    PLAN:    In order of problems listed above:  1. Coronary artery disease: Secondary prevention stressed with the patient.  Importance of compliance with diet and medication stressed and she vocalized understanding.  Importance of regular exercise stressed and she promises to comply 2. Essential hypertension: Her blood pressure stable and diet was discussed 3. Mixed dyslipidemia: The patient is intolerant to statins.  I will get her back in the next few days for blood work and consider other lipid lowering therapies 4. I have asked her to cut down her aspirin to 81 mg daily. Sublingual nitroglycerin prescription was sent, its protocol and 911 protocol explained and the patient vocalized understanding questions were answered to the patient's satisfaction. 5. Patient will be seen in follow-up appointment in 6 months or earlier if the patient has any concerns    Medication Adjustments/Labs and Tests Ordered: Current medicines are reviewed at length with the patient today.  Concerns regarding medicines are outlined above.  No orders of the defined types were placed in this encounter.  No orders of the defined types were placed in this encounter.    History of Present Illness:    Alyssa Cooper is a 78 y.o. female who is being seen today for the evaluation of coronary artery disease at the request of [place], Angelique Blonder, MD.  This patient has been under my care in my previous practice.  He is  here now to transfer his care and be established with my current practice.  Patient is a pleasant 78 year old female.  She has past medical history of coronary arteries post CABG surgery in the remote past.  She has history of essential hypertension and dyslipidemia.  She is a diabetic.  She mentions to me that she is here for getting be established.  Unfortunately her husband passed away last year and she was spending a lot of time taking care of him.  Currently she is asymptomatic.  At the time of my evaluation, the patient is alert awake oriented and in no distress.  She leads a sedentary lifestyle.  Past Medical History:  Diagnosis Date  . Anemia, pernicious   . Chronic ischemic heart disease   . Chronic kidney disease   . Chronic kidney disease, stage 3 (moderate) ([place]) 12/01/2018  . Helicobacter heilmannii gastritis   . History of melanoma   . Hypertension 12/01/2018  . Obesity   . Osteoarthritis   . Pure hypercholesterolemia   . Renal osteodystrophy 12/01/2018  . Type 2 diabetes mellitus with diabetic chronic kidney disease ([place]) 12/01/2018  . Vitamin D deficiency      Current Medications: Current Meds  Medication Sig  . amLODipine (NORVASC) 5 MG tablet Take 1 tablet by mouth daily.  Marland Kitchen aspirin 325 MG tablet Take 1 tablet by mouth daily.  . carvedilol (COREG) 25 MG tablet Take 1 tablet by mouth daily.  . cloNIDine (CATAPRES) 0.1 MG tablet Take  0.1 mg by mouth at bedtime.  . ergocalciferol (VITAMIN D2) 1.25 MG (50000 UT) capsule Take 50,000 Units by mouth 2 (two) times a week.  . ezetimibe (ZETIA) 10 MG tablet Take 10 mg by mouth daily.  . furosemide (LASIX) 20 MG tablet Take 20 mg by mouth daily.  . insulin glargine (LANTUS) 100 UNIT/ML injection Inject 25 Units into the skin Nightly.  . liraglutide (VICTOZA) 18 MG/3ML SOPN   . quinapril (ACCUPRIL) 40 MG tablet Take 1 tablet by mouth daily.  . rosuvastatin (CRESTOR) 5 MG tablet TK 1 T PO  TWICE WEEKLY  AT NIGHT  ONLY      Allergies:   Penicillins, Amlodipine, Atorvastatin, Crestor [rosuvastatin calcium], Lipitor [atorvastatin calcium], Livalo [pitavastatin], Motrin [ibuprofen], Nsaids, Tramadol, Bactrim [sulfamethoxazole-trimethoprim], and Furosemide   Social History   Socioeconomic History  . Marital status: Married    Spouse name: Not on file  . Number of children: Not on file  . Years of education: Not on file  . Highest education level: Not on file  Occupational History  . Not on file  Social Needs  . Financial resource strain: Not on file  . Food insecurity    Worry: Not on file    Inability: Not on file  . Transportation needs    Medical: Not on file    Non-medical: Not on file  Tobacco Use  . Smoking status: Never Smoker  . Smokeless tobacco: Never Used  Substance and Sexual Activity  . Alcohol use: Never    Frequency: Never  . Drug use: Never  . Sexual activity: Not on file  Lifestyle  . Physical activity    Days per week: Not on file    Minutes per session: Not on file  . Stress: Not on file  Relationships  . Social Herbalist on phone: Not on file    Gets together: Not on file    Attends religious service: Not on file    Active member of club or organization: Not on file    Attends meetings of clubs or organizations: Not on file    Relationship status: Not on file  Other Topics Concern  . Not on file  Social History Narrative  . Not on file     Family History: The patient's family history includes Diabetes in her father and sister; Lung cancer in her brother.  ROS:   Please see the history of present illness.    All other systems reviewed and are negative.  EKGs/Labs/Other Studies Reviewed:    The following studies were reviewed today: EKG reveals sinus rhythm and nonspecific ST-T changes   Recent Labs: No results found for requested labs within last 8760 hours.  Recent Lipid Panel No results found for: CHOL, TRIG, HDL, CHOLHDL, VLDL, LDLCALC,  LDLDIRECT  Physical Exam:    VS:  BP 130/78 (BP Location: Right Arm, Patient Position: Sitting, Cuff Size: Normal)   Pulse 78   Ht 5\' 3"  (1.6 m)   Wt 192 lb (87.1 kg)   SpO2 96%   BMI 34.01 kg/m     Wt Readings from Last 3 Encounters:  05/26/19 192 lb (87.1 kg)     GEN: Patient is in no acute distress HEENT: Normal NECK: No JVD; No carotid bruits LYMPHATICS: No lymphadenopathy CARDIAC: S1 S2 regular, 2/6 systolic murmur at the apex. RESPIRATORY:  Clear to auscultation without rales, wheezing or rhonchi  ABDOMEN: Soft, non-tender, non-distended MUSCULOSKELETAL:  No edema; No deformity  SKIN:  Warm and dry NEUROLOGIC:  Alert and oriented x 3 PSYCHIATRIC:  Normal affect    Signed, Jenean Lindau, MD  05/26/2019 2:01 PM    [place]

## 2019-05-26 NOTE — Addendum Note (Signed)
Addended by: Jyl Heinz R on: 05/26/2019 02:18 PM   Modules accepted: Level of Service

## 2019-05-26 NOTE — Addendum Note (Signed)
Addended by: Beckey Rutter on: 05/26/2019 03:05 PM   Modules accepted: Orders

## 2019-05-26 NOTE — Addendum Note (Signed)
Addended by: Beckey Rutter on: 05/26/2019 02:15 PM   Modules accepted: Orders

## 2019-05-28 ENCOUNTER — Other Ambulatory Visit: Payer: Self-pay

## 2019-05-28 ENCOUNTER — Ambulatory Visit
Admission: RE | Admit: 2019-05-28 | Discharge: 2019-05-28 | Disposition: A | Discharge status: Home / Self Care | Payer: Medicare Other | Source: Ambulatory Visit | Attending: Orthopedic Surgery | Admitting: Orthopedic Surgery

## 2019-05-28 DIAGNOSIS — M25511 Pain in right shoulder: Secondary | ICD-10-CM

## 2019-05-31 ENCOUNTER — Other Ambulatory Visit: Payer: Self-pay

## 2019-05-31 ENCOUNTER — Ambulatory Visit (INDEPENDENT_AMBULATORY_CARE_PROVIDER_SITE_OTHER): Payer: Medicare Other

## 2019-05-31 ENCOUNTER — Telehealth (HOSPITAL_COMMUNITY): Payer: Self-pay | Admitting: *Deleted

## 2019-05-31 DIAGNOSIS — I10 Essential (primary) hypertension: Secondary | ICD-10-CM

## 2019-05-31 NOTE — Progress Notes (Signed)
Complete echocardiogram has been performed.  Jimmy Destry Bezdek RDCS, RVT 

## 2019-05-31 NOTE — Telephone Encounter (Signed)
Patient given detailed instructions per Myocardial Perfusion Study Information Sheet for the test on 06/01/19 at 8:00. Patient notified to arrive 15 minutes early and that it is imperative to arrive on time for appointment to keep from having the test rescheduled.  If you need to cancel or reschedule your appointment, please call the office within 24 hours of your appointment. . Patient verbalized understanding.Veronia Beets

## 2019-06-01 ENCOUNTER — Ambulatory Visit (INDEPENDENT_AMBULATORY_CARE_PROVIDER_SITE_OTHER): Payer: Medicare Other

## 2019-06-01 DIAGNOSIS — I2510 Atherosclerotic heart disease of native coronary artery without angina pectoris: Secondary | ICD-10-CM

## 2019-06-01 DIAGNOSIS — Z01818 Encounter for other preprocedural examination: Secondary | ICD-10-CM

## 2019-06-01 DIAGNOSIS — Z951 Presence of aortocoronary bypass graft: Secondary | ICD-10-CM

## 2019-06-01 MED ORDER — TECHNETIUM TC 99M TETROFOSMIN IV KIT
32.7000 | PACK | Freq: Once | INTRAVENOUS | Status: AC | PRN
  Administered 2019-06-01: 32.7 via INTRAVENOUS

## 2019-06-01 MED ORDER — REGADENOSON 0.4 MG/5ML IV SOLN
0.4000 mg | Freq: Once | INTRAVENOUS | Status: AC
  Administered 2019-06-01: 0.4 mg via INTRAVENOUS

## 2019-06-01 MED ORDER — TECHNETIUM TC 99M TETROFOSMIN IV KIT
11.0000 | PACK | Freq: Once | INTRAVENOUS | Status: AC | PRN
  Administered 2019-06-01: 11 via INTRAVENOUS

## 2019-06-02 LAB — MYOCARDIAL PERFUSION IMAGING
LV dias vol: 73 mL (ref 46–106)
LV sys vol: 22 mL
Peak HR: 83 {beats}/min
Rest HR: 71 {beats}/min
SDS: 1
SRS: 1
SSS: 2
TID: 1.06

## 2019-06-04 ENCOUNTER — Telehealth: Payer: Self-pay

## 2019-06-04 NOTE — Telephone Encounter (Signed)
-----   Message from Jenean Lindau, MD sent at 06/02/2019  9:09 AM EDT ----- Needs appt Jenean Lindau, MD 06/02/2019 9:09 AM

## 2019-06-04 NOTE — Telephone Encounter (Signed)
Information relayed, patient will come into office on 06/11/19 for discussion.

## 2019-06-11 ENCOUNTER — Encounter: Payer: Self-pay | Admitting: Cardiology

## 2019-06-11 ENCOUNTER — Ambulatory Visit (INDEPENDENT_AMBULATORY_CARE_PROVIDER_SITE_OTHER): Payer: Medicare Other | Admitting: Cardiology

## 2019-06-11 ENCOUNTER — Ambulatory Visit: Payer: Medicare Other | Admitting: Cardiology

## 2019-06-11 ENCOUNTER — Other Ambulatory Visit: Payer: Self-pay

## 2019-06-11 VITALS — BP 148/58 | HR 71 | Ht 63.0 in | Wt 194.0 lb

## 2019-06-11 DIAGNOSIS — E7800 Pure hypercholesterolemia, unspecified: Secondary | ICD-10-CM

## 2019-06-11 DIAGNOSIS — I2510 Atherosclerotic heart disease of native coronary artery without angina pectoris: Secondary | ICD-10-CM

## 2019-06-11 DIAGNOSIS — I10 Essential (primary) hypertension: Secondary | ICD-10-CM

## 2019-06-11 DIAGNOSIS — Z951 Presence of aortocoronary bypass graft: Secondary | ICD-10-CM

## 2019-06-11 DIAGNOSIS — R079 Chest pain, unspecified: Secondary | ICD-10-CM

## 2019-06-11 DIAGNOSIS — R931 Abnormal findings on diagnostic imaging of heart and coronary circulation: Secondary | ICD-10-CM

## 2019-06-11 DIAGNOSIS — I259 Chronic ischemic heart disease, unspecified: Secondary | ICD-10-CM

## 2019-06-11 DIAGNOSIS — E088 Diabetes mellitus due to underlying condition with unspecified complications: Secondary | ICD-10-CM

## 2019-06-11 DIAGNOSIS — Z01810 Encounter for preprocedural cardiovascular examination: Secondary | ICD-10-CM

## 2019-06-11 HISTORY — DX: Abnormal findings on diagnostic imaging of heart and coronary circulation: R93.1

## 2019-06-11 HISTORY — DX: Encounter for preprocedural cardiovascular examination: Z01.810

## 2019-06-11 MED ORDER — METOPROLOL TARTRATE 50 MG PO TABS: 100.0000 mg | ORAL_TABLET | Freq: Once | ORAL | 0 refills | Status: DC

## 2019-06-11 NOTE — Progress Notes (Signed)
Cardiology Office Note:    Date:  06/11/2019   ID:  Alyssa Cooper, DOB 1941/09/03, MRN [id number]  PCP:  Angelina Sheriff, MD  Cardiologist:  Jenean Lindau, MD   Referring MD: Angelina Sheriff, MD    ASSESSMENT:    1. Preoperative cardiovascular examination   2. Coronary artery disease involving native coronary artery of native heart without angina pectoris   3. Chronic ischemic heart disease   4. Essential hypertension   5. Diabetes mellitus due to underlying condition with unspecified complications ([place])   6. Hx of CABG   7. Pure hypercholesterolemia   8. Abnormal nuclear cardiac imaging test    PLAN:    In order of problems listed above:  1. Preoperative cardiovascular evaluation: I discussed my findings with the patient at extensive length.  Her surgery is pretty significant and she is 78 year old and has multiple risk factors for coronary artery disease and established disease in view of this I discussed CT coronary angiography and invasive evaluation.  She prefers the former and we will set her up for the same. 2. Essential hypertension: Blood pressure stable 3. Mixed dyslipidemia: I will get a report from primary care physician and review it for optimizing lipid therapy if necessary 4. Patient will be seen in follow-up appointment in 2 months or earlier if the patient has any concerns    Medication Adjustments/Labs and Tests Ordered: Current medicines are reviewed at length with the patient today.  Concerns regarding medicines are outlined above.  No orders of the defined types were placed in this encounter.  No orders of the defined types were placed in this encounter.    Chief Complaint  Patient presents with  . Follow-up    lexiscan     History of Present Illness:    Alyssa Cooper is a 78 y.o. female.  Patient has past medical history of coronary artery disease post CABG surgery, essential hypertension, dyslipidemia and diabetes mellitus.  She is here  for evaluation for preop assessment and a stress test was mildly abnormal.  She leads a sedentary lifestyle.  No chest pain orthopnea or PND.  At the time of my evaluation, the patient is alert awake oriented and in no distress.  Past Medical History:  Diagnosis Date  . Anemia, pernicious   . Chronic ischemic heart disease   . Chronic kidney disease   . Chronic kidney disease, stage 3 (moderate) 12/01/2018  . Helicobacter heilmannii gastritis   . History of melanoma   . Hypertension 12/01/2018  . Obesity   . Osteoarthritis   . Pure hypercholesterolemia   . Renal osteodystrophy 12/01/2018  . Type 2 diabetes mellitus with diabetic chronic kidney disease ([place]) 12/01/2018  . Vitamin D deficiency      Current Medications: Current Meds  Medication Sig  . amLODipine (NORVASC) 5 MG tablet Take 1 tablet by mouth daily.  Marland Kitchen aspirin EC 81 MG tablet Take 81 mg by mouth daily.  . carvedilol (COREG) 25 MG tablet Take 1 tablet by mouth daily.  . cloNIDine (CATAPRES) 0.1 MG tablet Take 0.1 mg by mouth at bedtime.  . ergocalciferol (VITAMIN D2) 1.25 MG (50000 UT) capsule Take 50,000 Units by mouth 2 (two) times a week.  . ezetimibe (ZETIA) 10 MG tablet Take 10 mg by mouth daily.  . furosemide (LASIX) 20 MG tablet Take 20 mg by mouth daily.  . insulin glargine (LANTUS) 100 UNIT/ML injection Inject 25 Units into the skin Nightly.  Marland Kitchen  liraglutide (VICTOZA) 18 MG/3ML SOPN   . nitroGLYCERIN (NITROSTAT) 0.4 MG SL tablet Place 1 tablet (0.4 mg total) under the tongue every 5 (five) minutes as needed.  . quinapril (ACCUPRIL) 40 MG tablet Take 1 tablet by mouth daily.  . rosuvastatin (CRESTOR) 5 MG tablet TK 1 T PO  TWICE WEEKLY  AT NIGHT  ONLY  . [DISCONTINUED] aspirin 325 MG tablet Take 1 tablet by mouth daily.     Allergies:   Penicillins, Amlodipine, Atorvastatin, Crestor [rosuvastatin calcium], Lipitor [atorvastatin calcium], Livalo [pitavastatin], Motrin [ibuprofen], Nsaids, Tramadol, Bactrim  [sulfamethoxazole-trimethoprim], and Furosemide   Social History   Socioeconomic History  . Marital status: Married    Spouse name: Not on file  . Number of children: Not on file  . Years of education: Not on file  . Highest education level: Not on file  Occupational History  . Not on file  Social Needs  . Financial resource strain: Not on file  . Food insecurity    Worry: Not on file    Inability: Not on file  . Transportation needs    Medical: Not on file    Non-medical: Not on file  Tobacco Use  . Smoking status: Never Smoker  . Smokeless tobacco: Never Used  Substance and Sexual Activity  . Alcohol use: Never    Frequency: Never  . Drug use: Never  . Sexual activity: Not on file  Lifestyle  . Physical activity    Days per week: Not on file    Minutes per session: Not on file  . Stress: Not on file  Relationships  . Social Herbalist on phone: Not on file    Gets together: Not on file    Attends religious service: Not on file    Active member of club or organization: Not on file    Attends meetings of clubs or organizations: Not on file    Relationship status: Not on file  Other Topics Concern  . Not on file  Social History Narrative  . Not on file     Family History: The patient's family history includes Diabetes in her father and sister; Lung cancer in her brother.  ROS:   Please see the history of present illness.    All other systems reviewed and are negative.  EKGs/Labs/Other Studies Reviewed:    The following studies were reviewed today:  The left ventricular ejection fraction is hyperdynamic (>65%).  Nuclear stress EF: 70%.  There was no ST segment deviation noted during stress.  Defect 1: There is a small defect of mild severity present in the mid anteroseptal location.  This is an intermediate risk study.  The area of perfusion defect is small in the anteroseptal wall. The small mild defect is below the RV insertion point.   Findings consistent with ischemia.   Recent Labs: No results found for requested labs within last 8760 hours.  Recent Lipid Panel No results found for: CHOL, TRIG, HDL, CHOLHDL, VLDL, LDLCALC, LDLDIRECT  Physical Exam:    VS:  BP (!) 148/58 (BP Location: Left Arm, Patient Position: Sitting, Cuff Size: Normal)   Pulse 71   Ht 5\' 3"  (1.6 m)   Wt 194 lb (88 kg)   SpO2 97%   BMI 34.37 kg/m     Wt Readings from Last 3 Encounters:  06/11/19 194 lb (88 kg)  06/01/19 192 lb (87.1 kg)  05/26/19 192 lb (87.1 kg)     GEN: Patient is in  no acute distress HEENT: Normal NECK: No JVD; No carotid bruits LYMPHATICS: No lymphadenopathy CARDIAC: Hear sounds regular, 2/6 systolic murmur at the apex. RESPIRATORY:  Clear to auscultation without rales, wheezing or rhonchi  ABDOMEN: Soft, non-tender, non-distended MUSCULOSKELETAL:  No edema; No deformity  SKIN: Warm and dry NEUROLOGIC:  Alert and oriented x 3 PSYCHIATRIC:  Normal affect   Signed, Jenean Lindau, MD  06/11/2019 11:50 AM    [place]

## 2019-06-11 NOTE — Patient Instructions (Addendum)
Medication Instructions:  Your physician recommends that you continue on your current medications as directed. Please refer to the Current Medication list given to you today.  If you need a refill on your cardiac medications before your next appointment, please call your pharmacy.   Lab work: You will need to come in 7 days prior to procedure to have a BMP drawn.  If you have labs (blood work) drawn today and your tests are completely normal, you will receive your results only by:  [place] (if you have MyChart) OR  A paper copy in the mail If you have any lab test that is abnormal or we need to change your treatment, we will call you to review the results.  Testing/Procedures: Non-Cardiac CT scanning, (CAT scanning), is a noninvasive, special x-ray that produces cross-sectional images of the body using x-rays and a computer. CT scans help physicians diagnose and treat medical conditions. For some CT exams, a contrast material is used to enhance visibility in the area of the body being studied. CT scans provide greater clarity and reveal more details than regular x-ray exams.  Please arrive at the [facility] main entrance of [facility] at xx:xx AM (30-45 minutes prior to test start time)  [facility] [place], [place] [address] [phone]  Proceed to the [facility] Radiology Department (First Floor).  Please follow these instructions carefully (unless otherwise directed):  On the Night Before the Test:  Be sure to Drink plenty of water.  Do not consume any caffeinated/decaffeinated beverages or chocolate 12 hours prior to your test.  Do not take any antihistamines 12 hours prior to your test.  TAKE half dose of lantus before bed  On the Day of the Test:  Drink plenty of water. Do not drink any water within one hour of the test.  Do not eat any food 4 hours prior to the test.  You may take your regular medications prior to the  test.   Take metoprolol (Lopressor) two hours prior to test if HR is 55 BPM or greater  HOLD Furosemide morning of the test.      After the Test:  Drink plenty of water.  After receiving IV contrast, you may experience a mild flushed feeling. This is normal.  On occasion, you may experience a mild rash up to 24 hours after the test. This is not dangerous. If this occurs, you can take Benadryl 25 mg and increase your fluid intake.  If you experience trouble breathing, this can be serious. If it is severe call 911 IMMEDIATELY. If it is mild, please call our office.  If you take any of these medications: Glipizide/Metformin, Avandament, Glucavance, please do not take 48 hours after completing test.   Follow-Up: At [facility], you and your health needs are our priority.  As part of our continuing mission to provide you with exceptional heart care, we have created designated Provider Care Teams.  These Care Teams include your primary Cardiologist (physician) and Advanced Practice Providers (APPs -  Physician Assistants and Nurse Practitioners) who all work together to provide you with the care you need, when you need it. You will need a follow up appointment in 1 months.    Any Other Special Instructions Will Be Listed Below  Metoprolol tablets What is this medicine? METOPROLOL (me TOE proe lole) is a beta-blocker. Beta-blockers reduce the workload on the heart and help it to beat more regularly. This medicine is used  to treat high blood pressure and to prevent chest pain. It is also used to after a heart attack and to prevent an additional heart attack from occurring. This medicine may be used for other purposes; ask your health care provider or pharmacist if you have questions. COMMON BRAND NAME(S): Lopressor What should I tell my health care provider before I take this medicine? They need to know if you have any of these conditions: -diabetes -heart or vessel disease like slow heart  rate, worsening heart failure, heart block, sick sinus syndrome or Raynaud's disease -kidney disease -liver disease -lung or breathing disease, like asthma or emphysema -pheochromocytoma -thyroid disease -an unusual or allergic reaction to metoprolol, other beta-blockers, medicines, foods, dyes, or preservatives -pregnant or trying to get pregnant -breast-feeding How should I use this medicine? Take this medicine by mouth with a drink of water. Follow the directions on the prescription label. Take this medicine immediately after meals. Take your doses at regular intervals. Do not take more medicine than directed. Do not stop taking this medicine suddenly. This could lead to serious heart-related effects. Talk to your pediatrician regarding the use of this medicine in children. Special care may be needed. Overdosage: If you think you have taken too much of this medicine contact a poison control center or emergency room at once. NOTE: This medicine is only for you. Do not share this medicine with others. What if I miss a dose? If you miss a dose, take it as soon as you can. If it is almost time for your next dose, take only that dose. Do not take double or extra doses. What may interact with this medicine? This medicine may interact with the following medications: -certain medicines for blood pressure, heart disease, irregular heart beat -certain medicines for depression like monoamine oxidase (MAO) inhibitors, fluoxetine, or paroxetine -clonidine -dobutamine -epinephrine -isoproterenol -reserpine This list may not describe all possible interactions. Give your health care provider a list of all the medicines, herbs, non-prescription drugs, or dietary supplements you use. Also tell them if you smoke, drink alcohol, or use illegal drugs. Some items may interact with your medicine. What should I watch for while using this medicine? Visit your doctor or health care professional for regular check  ups. Contact your doctor right away if your symptoms worsen. Check your blood pressure and pulse rate regularly. Ask your health care professional what your blood pressure and pulse rate should be, and when you should contact them. You may get drowsy or dizzy. Do not drive, use machinery, or do anything that needs mental alertness until you know how this medicine affects you. Do not sit or stand up quickly, especially if you are an older patient. This reduces the risk of dizzy or fainting spells. Contact your doctor if these symptoms continue. Alcohol may interfere with the effect of this medicine. Avoid alcoholic drinks. What side effects may I notice from receiving this medicine? Side effects that you should report to your doctor or health care professional as soon as possible: -allergic reactions like skin rash, itching or hives -cold or numb hands or feet -depression -difficulty breathing -faint -fever with sore throat -irregular heartbeat, chest pain -rapid weight gain -swollen legs or ankles Side effects that usually do not require medical attention (report to your doctor or health care professional if they continue or are bothersome): -anxiety or nervousness -change in sex drive or performance -dry skin -headache -nightmares or trouble sleeping -short term memory loss -stomach upset or diarrhea -unusually  tired This list may not describe all possible side effects. Call your doctor for medical advice about side effects. You may report side effects to FDA at 1-800-FDA-1088. Where should I keep my medicine? Keep out of the reach of children. Store at room temperature between 15 and 30 degrees C (59 and 86 degrees F). Throw away any unused medicine after the expiration date. NOTE: This sheet is a summary. It may not cover all possible information. If you have questions about this medicine, talk to your doctor, pharmacist, or health care provider.  2019 Elsevier/Gold Standard  (2013-04-30 14:40:36)   Coronary Angiogram A coronary angiogram is an X-ray procedure that is used to examine the arteries in the heart. In this procedure, a dye (contrast dye) is injected through a long, thin tube (catheter). The catheter is inserted through the groin, wrist, or arm. The dye is injected into each artery, then X-rays are taken to show if there is a blockage in the arteries of the heart. This procedure can also show if you have valve disease or a disease of the aorta, and it can be used to check the overall function of your heart muscle. You may have a coronary angiogram if:  You are having chest pain, or other symptoms of angina, and you are at risk for heart disease.  You have an abnormal electrocardiogram (ECG) or stress test.  You have chest pain and heart failure.  You are having irregular heart rhythms.  You and your health care provider determine that the benefits of the test information outweigh the risks of the procedure. Let your health care provider know about:  Any allergies you have, including allergies to contrast dye.  All medicines you are taking, including vitamins, herbs, eye drops, creams, and over-the-counter medicines.  Any problems you or family members have had with anesthetic medicines.  Any blood disorders you have.  Any surgeries you have had.  History of kidney problems or kidney failure.  Any medical conditions you have.  Whether you are pregnant or may be pregnant. What are the risks? Generally, this is a safe procedure. However, problems may occur, including:  Infection.  Allergic reaction to medicines or dyes that are used.  Bleeding from the access site or other locations.  Kidney injury, especially in people with impaired kidney function.  Stroke (rare).  Heart attack (rare).  Damage to other structures or organs. What happens before the procedure? Staying hydrated Follow instructions from your health care provider  about hydration, which may include:  Up to 2 hours before the procedure - you may continue to drink clear liquids, such as water, clear fruit juice, black coffee, and plain tea. Eating and drinking restrictions Follow instructions from your health care provider about eating and drinking, which may include:  8 hours before the procedure - stop eating heavy meals or foods such as meat, fried foods, or fatty foods.  6 hours before the procedure - stop eating light meals or foods, such as toast or cereal.  2 hours before the procedure - stop drinking clear liquids. General instructions  Ask your health care provider about: ? Changing or stopping your regular medicines. This is especially important if you are taking diabetes medicines or blood thinners. ? Taking medicines such as ibuprofen. These medicines can thin your blood. Do not take these medicines before your procedure if your health care provider instructs you not to, though aspirin may be recommended prior to coronary angiograms.  Plan to have someone take  you home from the hospital or clinic.  You may need to have blood tests or X-rays done. What happens during the procedure?  An IV tube will be inserted into one of your veins.  You will be given one or more of the following: ? A medicine to help you relax (sedative). ? A medicine to numb the area where the catheter will be inserted into an artery (local anesthetic).  To reduce your risk of infection: ? Your health care team will wash or sanitize their hands. ? Your skin will be washed with soap. ? Hair may be removed from the area where the catheter will be inserted.  You will be connected to a continuous ECG monitor.  The catheter will be inserted into an artery. The location may be in your groin, in your wrist, or in the fold of your arm (near your elbow).  A type of X-ray (fluoroscopy) will be used to help guide the catheter to the opening of the blood vessel that is  being examined.  A dye will be injected into the catheter, and X-rays will be taken. The dye will help to show where any narrowing or blockages are located in the heart arteries.  Tell your health care provider if you have any chest pain or trouble breathing during the procedure.  If blockages are found, your health care provider may perform another procedure, such as inserting a coronary stent. The procedure may vary among health care providers and hospitals. What happens after the procedure?  After the procedure, you will need to keep the area still for a few hours, or for as long as told by your health care provider. If the procedure is done through the groin, you will be instructed to not bend and not cross your legs.  The insertion site will be checked frequently.  The pulse in your foot or wrist will be checked frequently.  You may have additional blood tests, X-rays, and a test that records the electrical activity of your heart (ECG).  Do not drive for 24 hours if you were given a sedative. Summary  A coronary angiogram is an X-ray procedure that is used to look into the arteries in the heart.  During the procedure, a dye (contrast dye) is injected through a long, thin tube (catheter). The catheter is inserted through the groin, wrist, or arm.  Tell your health care provider about any allergies you have, including allergies to contrast dye.  After the procedure, you will need to keep the area still for a few hours, or for as long as told by your health care provider. This information is not intended to replace advice given to you by your health care provider. Make sure you discuss any questions you have with your health care provider. Document Released: 03/02/2003 Document Revised: 06/07/2016 Document Reviewed: 06/07/2016 Elsevier Interactive Patient Education  2019 [employer].

## 2019-06-23 ENCOUNTER — Telehealth: Payer: Self-pay | Admitting: Cardiology

## 2019-06-23 DIAGNOSIS — E113291 Type 2 diabetes mellitus with mild nonproliferative diabetic retinopathy without macular edema, right eye: Secondary | ICD-10-CM | POA: Diagnosis not present

## 2019-06-23 DIAGNOSIS — E119 Type 2 diabetes mellitus without complications: Secondary | ICD-10-CM | POA: Diagnosis not present

## 2019-06-23 DIAGNOSIS — Z961 Presence of intraocular lens: Secondary | ICD-10-CM | POA: Diagnosis not present

## 2019-06-23 DIAGNOSIS — H35041 Retinal micro-aneurysms, unspecified, right eye: Secondary | ICD-10-CM | POA: Diagnosis not present

## 2019-06-23 DIAGNOSIS — E109 Type 1 diabetes mellitus without complications: Secondary | ICD-10-CM | POA: Diagnosis not present

## 2019-06-23 NOTE — Telephone Encounter (Signed)
Wants to know why the surgery RRR scheduled her for was cancelled

## 2019-06-23 NOTE — Telephone Encounter (Signed)
Left message for patient to call back with more info about cancelled appt.

## 2019-06-24 NOTE — Telephone Encounter (Signed)
Spoke with patients grand-daughter Alyssa Cooper and unsure of what information was accurate.Patient states she was contacted by [facility] and told her procedure was cancelled. There are no notes in system that CT/FFR was even scheduled. Note sent to precert/scheduling to verify.

## 2019-06-28 ENCOUNTER — Other Ambulatory Visit: Payer: Medicare Other

## 2019-06-28 ENCOUNTER — Telehealth: Payer: Self-pay

## 2019-06-28 NOTE — Telephone Encounter (Signed)
Patients DPR Alyssa Cooper called again wanting to schedule CT FFR due to pending surgery. RN skyped A. Marlene Lard and S.Glo Herring to advise patient is calling again.

## 2019-06-29 ENCOUNTER — Telehealth: Payer: Self-pay | Admitting: Cardiology

## 2019-06-29 NOTE — Telephone Encounter (Signed)
Please call patient about her getting scheduled for CTA at [facility] and she is scheduled for 11/4 and she states that she needs labwork. Please call her

## 2019-06-29 NOTE — Telephone Encounter (Signed)
Called patient and reviewed lab schedule, patient will come by fasting one day next week. No further questions.

## 2019-07-06 DIAGNOSIS — I10 Essential (primary) hypertension: Secondary | ICD-10-CM | POA: Diagnosis not present

## 2019-07-06 DIAGNOSIS — Z01810 Encounter for preprocedural cardiovascular examination: Secondary | ICD-10-CM | POA: Diagnosis not present

## 2019-07-06 DIAGNOSIS — I2510 Atherosclerotic heart disease of native coronary artery without angina pectoris: Secondary | ICD-10-CM | POA: Diagnosis not present

## 2019-07-06 DIAGNOSIS — I259 Chronic ischemic heart disease, unspecified: Secondary | ICD-10-CM | POA: Diagnosis not present

## 2019-07-07 LAB — BASIC METABOLIC PANEL
BUN/Creatinine Ratio: 14 (ref 12–28)
BUN: 17 mg/dL (ref 8–27)
CO2: 24 mmol/L (ref 20–29)
Calcium: 9.9 mg/dL (ref 8.7–10.3)
Chloride: 108 mmol/L — ABNORMAL HIGH (ref 96–106)
Creatinine, Ser: 1.18 mg/dL — ABNORMAL HIGH (ref 0.57–1.00)
GFR calc Af Amer: 50 mL/min/{1.73_m2} — ABNORMAL LOW (ref >59)
GFR calc non Af Amer: 44 mL/min/{1.73_m2} — ABNORMAL LOW (ref >59)
Glucose: 133 mg/dL — ABNORMAL HIGH (ref 65–99)
Potassium: 4.4 mmol/L (ref 3.5–5.2)
Sodium: 144 mmol/L (ref 134–144)

## 2019-07-08 ENCOUNTER — Telehealth: Payer: Self-pay

## 2019-07-08 NOTE — Telephone Encounter (Signed)
-----   Message from Jenean Lindau, MD sent at 07/07/2019  8:44 AM EDT ----- The results of the study is unremarkable.  Recheck in 1 month.  Please inform patient. I will discuss in detail at next appointment. Cc  primary care/referring physician Jenean Lindau, MD 07/07/2019 8:44 AM

## 2019-07-08 NOTE — Telephone Encounter (Signed)
Patient scheduled for ct ffr on 07/14/19 do you want to repeat labs prior to procedure? Message routed Dr. Docia Furl for advisement. RN attempted to contact patient, no vm setup. Will continue to try. Copy of labs sent to Dr. Lin Landsman.

## 2019-07-12 DIAGNOSIS — E1165 Type 2 diabetes mellitus with hyperglycemia: Secondary | ICD-10-CM | POA: Diagnosis not present

## 2019-07-13 DIAGNOSIS — Z6833 Body mass index (BMI) 33.0-33.9, adult: Secondary | ICD-10-CM | POA: Diagnosis not present

## 2019-07-13 DIAGNOSIS — Z1331 Encounter for screening for depression: Secondary | ICD-10-CM | POA: Diagnosis not present

## 2019-07-13 DIAGNOSIS — Z9181 History of falling: Secondary | ICD-10-CM | POA: Diagnosis not present

## 2019-07-13 DIAGNOSIS — E1165 Type 2 diabetes mellitus with hyperglycemia: Secondary | ICD-10-CM | POA: Diagnosis not present

## 2019-07-14 ENCOUNTER — Other Ambulatory Visit: Payer: Self-pay

## 2019-07-14 ENCOUNTER — Telehealth (HOSPITAL_COMMUNITY): Payer: Self-pay | Admitting: Emergency Medicine

## 2019-07-14 ENCOUNTER — Encounter: Payer: Self-pay | Admitting: Cardiology

## 2019-07-14 ENCOUNTER — Ambulatory Visit (HOSPITAL_COMMUNITY): Payer: Medicare Other

## 2019-07-14 ENCOUNTER — Ambulatory Visit (INDEPENDENT_AMBULATORY_CARE_PROVIDER_SITE_OTHER): Payer: Medicare Other | Admitting: Cardiology

## 2019-07-14 ENCOUNTER — Encounter (HOSPITAL_COMMUNITY): Payer: Self-pay | Admitting: *Deleted

## 2019-07-14 ENCOUNTER — Telehealth: Payer: Self-pay | Admitting: Cardiology

## 2019-07-14 VITALS — BP 152/90 | HR 71 | Ht 63.0 in | Wt 192.0 lb

## 2019-07-14 DIAGNOSIS — N189 Chronic kidney disease, unspecified: Secondary | ICD-10-CM

## 2019-07-14 DIAGNOSIS — I10 Essential (primary) hypertension: Secondary | ICD-10-CM

## 2019-07-14 DIAGNOSIS — E1121 Type 2 diabetes mellitus with diabetic nephropathy: Secondary | ICD-10-CM

## 2019-07-14 DIAGNOSIS — R931 Abnormal findings on diagnostic imaging of heart and coronary circulation: Secondary | ICD-10-CM

## 2019-07-14 DIAGNOSIS — Z951 Presence of aortocoronary bypass graft: Secondary | ICD-10-CM | POA: Diagnosis not present

## 2019-07-14 DIAGNOSIS — Z01810 Encounter for preprocedural cardiovascular examination: Secondary | ICD-10-CM | POA: Diagnosis not present

## 2019-07-14 DIAGNOSIS — J439 Emphysema, unspecified: Secondary | ICD-10-CM | POA: Diagnosis not present

## 2019-07-14 DIAGNOSIS — N1831 Chronic kidney disease, stage 3a: Secondary | ICD-10-CM

## 2019-07-14 DIAGNOSIS — E088 Diabetes mellitus due to underlying condition with unspecified complications: Secondary | ICD-10-CM | POA: Diagnosis not present

## 2019-07-14 DIAGNOSIS — E7800 Pure hypercholesterolemia, unspecified: Secondary | ICD-10-CM

## 2019-07-14 DIAGNOSIS — E1122 Type 2 diabetes mellitus with diabetic chronic kidney disease: Secondary | ICD-10-CM

## 2019-07-14 DIAGNOSIS — I259 Chronic ischemic heart disease, unspecified: Secondary | ICD-10-CM

## 2019-07-14 DIAGNOSIS — I2510 Atherosclerotic heart disease of native coronary artery without angina pectoris: Secondary | ICD-10-CM | POA: Diagnosis not present

## 2019-07-14 NOTE — Addendum Note (Signed)
Addended by: Beckey Rutter on: 07/14/2019 04:34 PM   Modules accepted: Orders

## 2019-07-14 NOTE — Progress Notes (Signed)
Pt and daughter arrive today stating that pt has appt for CT Heart. Per appt desk this is cancelled. Informed Clarise Cruz Ct Heart Navigator of this and that pt daughter Earnest Bailey would like to be called at [phone]. Pt daughter is very upset stating that her mother (pt) does not live in or near [place]. They drove 1.5 hours to arrive on time to appt and no one has informed them that appt was cancelled. Advised daughter that I will ask Clarise Cruz, navigator, to give her a call. Daughter appreciative. Clarise Cruz informed.

## 2019-07-14 NOTE — Telephone Encounter (Signed)
I sent you a message a day or two ago about this

## 2019-07-14 NOTE — Progress Notes (Signed)
Cardiology Office Note:    Date:  07/14/2019   ID:  Alyssa Cooper, DOB 1941-06-15, MRN [id number]  PCP:  Angelina Sheriff, MD  Cardiologist:  Jenean Lindau, MD   Referring MD: Angelina Sheriff, MD    ASSESSMENT:    1. Preoperative cardiovascular examination   2. Abnormal nuclear cardiac imaging test   3. Hx of CABG   4. Diabetes mellitus due to underlying condition with unspecified complications ([place])   5. Chronic kidney disease, unspecified CKD stage   6. Type 2 diabetes mellitus with stage 3a chronic kidney disease, without long-term current use of insulin ([place])   7. Coronary artery disease involving native coronary artery of native heart without angina pectoris   8. Essential hypertension   9. Pure hypercholesterolemia   10. Chronic ischemic heart disease    PLAN:    In order of problems listed above:  1. Coronary artery disease and preoperative risk stratification: Patient is planning to have a extensive shoulder surgery done.  I discussed my findings with the patient at extensive length.  I discussed coronary angiography and left heart catheterization at extensive length.  Procedure, benefits and potential risks were explained to the patient including the issues of renal insufficiency and she is agreeable.  Her granddaughter is a Marine scientist and she had multiple questions which were answered to her satisfaction.  Adequate hydration was emphasized.  I also mentioned to the that if her test is abnormal and she needs stenting and her surgery will have to be postponed.  She vocalized understanding.  In view of the fact that she has mild renal insufficiency I think it would be appropriate if she has only coronary angiography and not left ventriculography.  I will leave this to the discretion of the interventional cardiologist.  She will be seen in follow-up appointment after coronary angiography.   Medication Adjustments/Labs and Tests Ordered: Current medicines are reviewed at length  with the patient today.  Concerns regarding medicines are outlined above.  No orders of the defined types were placed in this encounter.  No orders of the defined types were placed in this encounter.    No chief complaint on file.    History of Present Illness:    Alyssa Cooper is a 78 y.o. female.  Patient has past medical history of coronary artery disease post CABG surgery, essential hypertension, dyslipidemia, diabetes mellitus.  She is planning to have shoulder surgery.  She leads a sedentary lifestyle.  Her stress test was abnormal and she was scheduled to get a CT coronary angiography but because of the nature of her CABG surgery and mildly renal insufficiency coronary angiography in a conventional manner was recommended.  Patient is here for the discussion today.  Past Medical History:  Diagnosis Date  . Anemia, pernicious   . Chronic ischemic heart disease   . Chronic kidney disease   . Chronic kidney disease, stage 3 (moderate) 12/01/2018  . Helicobacter heilmannii gastritis   . History of melanoma   . Hypertension 12/01/2018  . Obesity   . Osteoarthritis   . Pure hypercholesterolemia   . Renal osteodystrophy 12/01/2018  . Type 2 diabetes mellitus with diabetic chronic kidney disease ([place]) 12/01/2018  . Vitamin D deficiency     Past Surgical History:  Procedure Laterality Date  . BACK SURGERY  2011  . BUNIONECTOMY    . BYPASS GRAFT  2000  . PARTIAL HYSTERECTOMY    . REPLACEMENT TOTAL KNEE  Current Medications: Current Meds  Medication Sig  . amLODipine (NORVASC) 5 MG tablet Take 1 tablet by mouth daily.  Marland Kitchen aspirin EC 81 MG tablet Take 81 mg by mouth daily.  . carvedilol (COREG) 25 MG tablet Take 1 tablet by mouth daily.  . cloNIDine (CATAPRES) 0.1 MG tablet Take 0.1 mg by mouth at bedtime.  . ergocalciferol (VITAMIN D2) 1.25 MG (50000 UT) capsule Take 50,000 Units by mouth 2 (two) times a week.  . furosemide (LASIX) 20 MG tablet Take 20 mg by mouth daily as  needed.   . insulin glargine (LANTUS) 100 UNIT/ML injection Inject 30 Units into the skin Nightly.   . liraglutide (VICTOZA) 18 MG/3ML SOPN Inject 18 mg into the skin daily.   . nitroGLYCERIN (NITROSTAT) 0.4 MG SL tablet Place 1 tablet (0.4 mg total) under the tongue every 5 (five) minutes as needed.  . quinapril (ACCUPRIL) 40 MG tablet Take 1 tablet by mouth daily.  . rosuvastatin (CRESTOR) 5 MG tablet TK 1 T PO  TWICE WEEKLY  AT NIGHT  ONLY     Allergies:   Penicillins, Amlodipine, Atorvastatin, Crestor [rosuvastatin calcium], Lipitor [atorvastatin calcium], Livalo [pitavastatin], Motrin [ibuprofen], Nsaids, Tramadol, Bactrim [sulfamethoxazole-trimethoprim], and Furosemide   Social History   Socioeconomic History  . Marital status: Married    Spouse name: Not on file  . Number of children: Not on file  . Years of education: Not on file  . Highest education level: Not on file  Occupational History  . Not on file  Social Needs  . Financial resource strain: Not on file  . Food insecurity    Worry: Not on file    Inability: Not on file  . Transportation needs    Medical: Not on file    Non-medical: Not on file  Tobacco Use  . Smoking status: Never Smoker  . Smokeless tobacco: Never Used  Substance and Sexual Activity  . Alcohol use: Never    Frequency: Never  . Drug use: Never  . Sexual activity: Not on file  Lifestyle  . Physical activity    Days per week: Not on file    Minutes per session: Not on file  . Stress: Not on file  Relationships  . Social Herbalist on phone: Not on file    Gets together: Not on file    Attends religious service: Not on file    Active member of club or organization: Not on file    Attends meetings of clubs or organizations: Not on file    Relationship status: Not on file  Other Topics Concern  . Not on file  Social History Narrative  . Not on file     Family History: The patient's family history includes Diabetes in her  father and sister; Lung cancer in her brother.  ROS:   Please see the history of present illness.    All other systems reviewed and are negative.  EKGs/Labs/Other Studies Reviewed:    The following studies were reviewed today: Study Highlights  Addendum by Berniece Salines, DO on Wed Jun 02, 2019 9:08 AM   The left ventricular ejection fraction is hyperdynamic (>65%).  Nuclear stress EF: 70%.  There was no ST segment deviation noted during stress.  Defect 1: There is a small defect of mild severity present in the mid anteroseptal location.  This is an intermediate risk study.  The area of perfusion defect is small in the anteroseptal wall. The small mild defect  is below the RV insertion point.  Findings consistent with ischemia.      Recent Labs: 07/06/2019: BUN 17; Creatinine, Ser 1.18; Potassium 4.4; Sodium 144  Recent Lipid Panel No results found for: CHOL, TRIG, HDL, CHOLHDL, VLDL, LDLCALC, LDLDIRECT  Physical Exam:    VS:  BP (!) 152/90 (BP Location: Left Arm, Patient Position: Sitting, Cuff Size: Normal)   Pulse 71   Ht 5\' 3"  (1.6 m)   Wt 192 lb (87.1 kg)   SpO2 98%   BMI 34.01 kg/m     Wt Readings from Last 3 Encounters:  07/14/19 192 lb (87.1 kg)  06/11/19 194 lb (88 kg)  06/01/19 192 lb (87.1 kg)     GEN: Patient is in no acute distress HEENT: Normal NECK: No JVD; No carotid bruits LYMPHATICS: No lymphadenopathy CARDIAC: Hear sounds regular, 2/6 systolic murmur at the apex. RESPIRATORY:  Clear to auscultation without rales, wheezing or rhonchi  ABDOMEN: Soft, non-tender, non-distended MUSCULOSKELETAL:  No edema; No deformity  SKIN: Warm and dry NEUROLOGIC:  Alert and oriented x 3 PSYCHIATRIC:  Normal affect   Signed, Jenean Lindau, MD  07/14/2019 10:18 AM    [facility]

## 2019-07-14 NOTE — Patient Instructions (Addendum)
Medication Instructions:  Your physician recommends that you continue on your current medications as directed. Please refer to the Current Medication list given to you today.  *If you need a refill on your cardiac medications before your next appointment, please call your pharmacy*  Lab Work: Your physician recommends that you have a CBC and BMP drawn today  If you have labs (blood work) drawn today and your tests are completely normal, you will receive your results only by: Marland Kitchen MyChart Message (if you have MyChart) OR . A paper copy in the mail If you have any lab test that is abnormal or we need to change your treatment, we will call you to review the results.  A chest x-ray takes a picture of the organs and structures inside the chest, including the heart, lungs, and blood vessels. This test can show several things, including, whether the heart is enlarges; whether fluid is building up in the lungs; and whether pacemaker / defibrillator leads are still in place.  YOU HAVE been scheduled for CVD19 screening on 07/16/2019 at 2:15 PM at [place], [place]. Please get in the pre-screening line   Testing/Procedures: You had an EKG performed today  Your physician has requested that you have a cardiac catheterization. Cardiac catheterization is used to diagnose and/or treat various heart conditions. Doctors may recommend this procedure for a number of different reasons. The most common reason is to evaluate chest pain. Chest pain can be a symptom of coronary artery disease (CAD), and cardiac catheterization can show whether plaque is narrowing or blocking your heart's arteries. This procedure is also used to evaluate the valves, as well as measure the blood flow and oxygen levels in different parts of your heart. For further information please visit [email or web address][email or web address]. Please follow instruction sheet, as given.    Alyssa Cooper [place] [address] Dept: [phone] Loc: [phone]  AZARIE MARGOLIS  07/14/2019  You are scheduled for a Cardiac Catheterization on Thursday, November 12 with Dr. Irish Lack  1. Please arrive at the [facility] (Main Entrance A) at [facility]: [address] [place], [place] [address] at 0700 AM (This time is two hours before your procedure to ensure your preparation). Free valet parking service is available.   Special note: Every effort is made to have your procedure done on time. Please understand that emergencies sometimes delay scheduled procedures.  2. Diet: Do not eat solid foods after midnight.  The patient may have clear liquids until 5am upon the day of the procedure.  3. Labs: NONE 4. Medication instructions in preparation for your procedure:   Contrast Allergy: No    Stop taking, Lasix (Furosemide)  and carvedilol Wednesday, November 11,2020  STOP taking Victoza Wednesday Nov 11,2020  PLEASE TAKE aspirin morning of procedure with a sip of water  5. Plan for one night stay--bring personal belongings. 6. Bring a current list of your medications and current insurance cards. 7. You MUST have a responsible person to drive you home. 8. Someone MUST be with you the first 24 hours after you arrive home or your discharge will be delayed. 9. Please wear clothes that are easy to get on and off and wear slip-on shoes.  Thank you for allowing [place] to care for you!   -- [facility]   Follow-Up: At [facility], you and your health needs are our priority.  As  part of our continuing mission to provide you with exceptional heart care, we have created designated Provider Care Teams.  These Care Teams include your primary Cardiologist (physician) and Advanced Practice Providers (APPs -  Physician Assistants and Nurse Practitioners) who all work together to provide you with the care you need, when you need  it.  Your next appointment:   1 month  The format for your next appointment:   In Person  Provider:   Jyl Heinz, MD  Other Instructions   Coronary Angiogram A coronary angiogram is an X-ray procedure that is used to examine the arteries in the heart. In this procedure, a dye (contrast dye) is injected through a long, thin tube (catheter). The catheter is inserted through the groin, wrist, or arm. The dye is injected into each artery, then X-rays are taken to show if there is a blockage in the arteries of the heart. This procedure can also show if you have valve disease or a disease of the aorta, and it can be used to check the overall function of your heart muscle. You may have a coronary angiogram if:  You are having chest pain, or other symptoms of angina, and you are at risk for heart disease.  You have an abnormal electrocardiogram (ECG) or stress test.  You have chest pain and heart failure.  You are having irregular heart rhythms.  You and your health care provider determine that the benefits of the test information outweigh the risks of the procedure. Let your health care provider know about:  Any allergies you have, including allergies to contrast dye.  All medicines you are taking, including vitamins, herbs, eye drops, creams, and over-the-counter medicines.  Any problems you or family members have had with anesthetic medicines.  Any blood disorders you have.  Any surgeries you have had.  History of kidney problems or kidney failure.  Any medical conditions you have.  Whether you are pregnant or may be pregnant. What are the risks? Generally, this is a safe procedure. However, problems may occur, including:  Infection.  Allergic reaction to medicines or dyes that are used.  Bleeding from the access site or other locations.  Kidney injury, especially in people with impaired kidney function.  Stroke (rare).  Heart attack (rare).  Damage to other  structures or organs. What happens before the procedure? Staying hydrated Follow instructions from your health care provider about hydration, which may include:  Up to 2 hours before the procedure - you may continue to drink clear liquids, such as water, clear fruit juice, black coffee, and plain tea. Eating and drinking restrictions Follow instructions from your health care provider about eating and drinking, which may include:  8 hours before the procedure - stop eating heavy meals or foods such as meat, fried foods, or fatty foods.  6 hours before the procedure - stop eating light meals or foods, such as toast or cereal.  2 hours before the procedure - stop drinking clear liquids. General instructions  Ask your health care provider about: ? Changing or stopping your regular medicines. This is especially important if you are taking diabetes medicines or blood thinners. ? Taking medicines such as ibuprofen. These medicines can thin your blood. Do not take these medicines before your procedure if your health care provider instructs you not to, though aspirin may be recommended prior to coronary angiograms.  Plan to have someone take you home from the hospital or clinic.  You may need to have blood tests  or X-rays done. What happens during the procedure?  An IV tube will be inserted into one of your veins.  You will be given one or more of the following: ? A medicine to help you relax (sedative). ? A medicine to numb the area where the catheter will be inserted into an artery (local anesthetic).  To reduce your risk of infection: ? Your health care team will wash or sanitize their hands. ? Your skin will be washed with soap. ? Hair may be removed from the area where the catheter will be inserted.  You will be connected to a continuous ECG monitor.  The catheter will be inserted into an artery. The location may be in your groin, in your wrist, or in the fold of your arm (near your  elbow).  A type of X-ray (fluoroscopy) will be used to help guide the catheter to the opening of the blood vessel that is being examined.  A dye will be injected into the catheter, and X-rays will be taken. The dye will help to show where any narrowing or blockages are located in the heart arteries.  Tell your health care provider if you have any chest pain or trouble breathing during the procedure.  If blockages are found, your health care provider may perform another procedure, such as inserting a coronary stent. The procedure may vary among health care providers and hospitals. What happens after the procedure?  After the procedure, you will need to keep the area still for a few hours, or for as long as told by your health care provider. If the procedure is done through the groin, you will be instructed to not bend and not cross your legs.  The insertion site will be checked frequently.  The pulse in your foot or wrist will be checked frequently.  You may have additional blood tests, X-rays, and a test that records the electrical activity of your heart (ECG).  Do not drive for 24 hours if you were given a sedative. Summary  A coronary angiogram is an X-ray procedure that is used to look into the arteries in the heart.  During the procedure, a dye (contrast dye) is injected through a long, thin tube (catheter). The catheter is inserted through the groin, wrist, or arm.  Tell your health care provider about any allergies you have, including allergies to contrast dye.  After the procedure, you will need to keep the area still for a few hours, or for as long as told by your health care provider. This information is not intended to replace advice given to you by your health care provider. Make sure you discuss any questions you have with your health care provider. Document Released: 03/02/2003 Document Revised: 08/08/2017 Document Reviewed: 06/07/2016 Elsevier Patient Education  2020  [employer].

## 2019-07-14 NOTE — Telephone Encounter (Signed)
I spoke with pt's grand daughter who reports pt took lopressor this AM. When they got to [facility] they were notified CT has been cancelled.  [place] daughter is an Therapist, sports and can monitor pt's heart rate. Heart rate is currently in the 50's and pt is feeling OK other than slight dizziness when ambulating.  Pt and grand daughter are currently in the parking lot at [facility] and asking why procedure was cancelled.  Will ask Marchia Bond, RN to follow up with pt's grand daughter.

## 2019-07-14 NOTE — Telephone Encounter (Signed)
Phone call requested by [facility], patients granddaughter, who is highly upset that the patients cardiac CTA was cancelled without the patients knowledge.   I gave a sincere apology over the phone explaining that I should have called that patient to inform her however assumed the ordering provider would do so.   I also gave an explaination as to why her cardiac CTA was cancelled.   On Monday, I did chart review (as I do all cardiac CT patients) and found that she has a history of CABG in 2000 and renal insufficiency. After discussion with Ena Dawley, cardiac imaging specialist, it was decided that her cardiac CTA be cancelled due to inability to adequately assess bypass grafts and her risk for worsened renal function. And that cardiac cath was a better option for the patient.   A staff message was sent to ordering MD, nurse, [place] Si[place], and cardiac CT scheduling.   Patients granddaughter highly dissatisfied with lack of communication amongst the care team and patient involved.   Marchia Bond RN Navigator Cardiac Imaging [facility] [phone] Office  [phone] Cell

## 2019-07-14 NOTE — Telephone Encounter (Signed)
New Message      Pts grand daughter Alyssa Cooper is calling for the Pt  She says no one called the pt to cancel her CT appt and she took the 50mg  of metoprolol    Please call

## 2019-07-14 NOTE — H&P (View-Only) (Signed)
Cardiology Office Note:    Date:  07/14/2019   ID:  Alyssa Cooper, DOB 01-Feb-1941, MRN [id number]  PCP:  Angelina Sheriff, MD  Cardiologist:  Jenean Lindau, MD   Referring MD: Angelina Sheriff, MD    ASSESSMENT:    1. Preoperative cardiovascular examination   2. Abnormal nuclear cardiac imaging test   3. Hx of CABG   4. Diabetes mellitus due to underlying condition with unspecified complications ([place])   5. Chronic kidney disease, unspecified CKD stage   6. Type 2 diabetes mellitus with stage 3a chronic kidney disease, without long-term current use of insulin ([place])   7. Coronary artery disease involving native coronary artery of native heart without angina pectoris   8. Essential hypertension   9. Pure hypercholesterolemia   10. Chronic ischemic heart disease    PLAN:    In order of problems listed above:  1. Coronary artery disease and preoperative risk stratification: Patient is planning to have a extensive shoulder surgery done.  I discussed my findings with the patient at extensive length.  I discussed coronary angiography and left heart catheterization at extensive length.  Procedure, benefits and potential risks were explained to the patient including the issues of renal insufficiency and she is agreeable.  Her granddaughter is a Marine scientist and she had multiple questions which were answered to her satisfaction.  Adequate hydration was emphasized.  I also mentioned to the that if her test is abnormal and she needs stenting and her surgery will have to be postponed.  She vocalized understanding.  In view of the fact that she has mild renal insufficiency I think it would be appropriate if she has only coronary angiography and not left ventriculography.  I will leave this to the discretion of the interventional cardiologist.  She will be seen in follow-up appointment after coronary angiography.   Medication Adjustments/Labs and Tests Ordered: Current medicines are reviewed at length  with the patient today.  Concerns regarding medicines are outlined above.  No orders of the defined types were placed in this encounter.  No orders of the defined types were placed in this encounter.    No chief complaint on file.    History of Present Illness:    Alyssa Cooper is a 78 y.o. female.  Patient has past medical history of coronary artery disease post CABG surgery, essential hypertension, dyslipidemia, diabetes mellitus.  She is planning to have shoulder surgery.  She leads a sedentary lifestyle.  Her stress test was abnormal and she was scheduled to get a CT coronary angiography but because of the nature of her CABG surgery and mildly renal insufficiency coronary angiography in a conventional manner was recommended.  Patient is here for the discussion today.  Past Medical History:  Diagnosis Date  . Anemia, pernicious   . Chronic ischemic heart disease   . Chronic kidney disease   . Chronic kidney disease, stage 3 (moderate) 12/01/2018  . Helicobacter heilmannii gastritis   . History of melanoma   . Hypertension 12/01/2018  . Obesity   . Osteoarthritis   . Pure hypercholesterolemia   . Renal osteodystrophy 12/01/2018  . Type 2 diabetes mellitus with diabetic chronic kidney disease ([place]) 12/01/2018  . Vitamin D deficiency     Past Surgical History:  Procedure Laterality Date  . BACK SURGERY  2011  . BUNIONECTOMY    . BYPASS GRAFT  2000  . PARTIAL HYSTERECTOMY    . REPLACEMENT TOTAL KNEE  Current Medications: Current Meds  Medication Sig  . amLODipine (NORVASC) 5 MG tablet Take 1 tablet by mouth daily.  Marland Kitchen aspirin EC 81 MG tablet Take 81 mg by mouth daily.  . carvedilol (COREG) 25 MG tablet Take 1 tablet by mouth daily.  . cloNIDine (CATAPRES) 0.1 MG tablet Take 0.1 mg by mouth at bedtime.  . ergocalciferol (VITAMIN D2) 1.25 MG (50000 UT) capsule Take 50,000 Units by mouth 2 (two) times a week.  . furosemide (LASIX) 20 MG tablet Take 20 mg by mouth daily as  needed.   . insulin glargine (LANTUS) 100 UNIT/ML injection Inject 30 Units into the skin Nightly.   . liraglutide (VICTOZA) 18 MG/3ML SOPN Inject 18 mg into the skin daily.   . nitroGLYCERIN (NITROSTAT) 0.4 MG SL tablet Place 1 tablet (0.4 mg total) under the tongue every 5 (five) minutes as needed.  . quinapril (ACCUPRIL) 40 MG tablet Take 1 tablet by mouth daily.  . rosuvastatin (CRESTOR) 5 MG tablet TK 1 T PO  TWICE WEEKLY  AT NIGHT  ONLY     Allergies:   Penicillins, Amlodipine, Atorvastatin, Crestor [rosuvastatin calcium], Lipitor [atorvastatin calcium], Livalo [pitavastatin], Motrin [ibuprofen], Nsaids, Tramadol, Bactrim [sulfamethoxazole-trimethoprim], and Furosemide   Social History   Socioeconomic History  . Marital status: Married    Spouse name: Not on file  . Number of children: Not on file  . Years of education: Not on file  . Highest education level: Not on file  Occupational History  . Not on file  Social Needs  . Financial resource strain: Not on file  . Food insecurity    Worry: Not on file    Inability: Not on file  . Transportation needs    Medical: Not on file    Non-medical: Not on file  Tobacco Use  . Smoking status: Never Smoker  . Smokeless tobacco: Never Used  Substance and Sexual Activity  . Alcohol use: Never    Frequency: Never  . Drug use: Never  . Sexual activity: Not on file  Lifestyle  . Physical activity    Days per week: Not on file    Minutes per session: Not on file  . Stress: Not on file  Relationships  . Social Herbalist on phone: Not on file    Gets together: Not on file    Attends religious service: Not on file    Active member of club or organization: Not on file    Attends meetings of clubs or organizations: Not on file    Relationship status: Not on file  Other Topics Concern  . Not on file  Social History Narrative  . Not on file     Family History: The patient's family history includes Diabetes in her  father and sister; Lung cancer in her brother.  ROS:   Please see the history of present illness.    All other systems reviewed and are negative.  EKGs/Labs/Other Studies Reviewed:    The following studies were reviewed today: Study Highlights  Addendum by Berniece Salines, DO on Wed Jun 02, 2019 9:08 AM   The left ventricular ejection fraction is hyperdynamic (>65%).  Nuclear stress EF: 70%.  There was no ST segment deviation noted during stress.  Defect 1: There is a small defect of mild severity present in the mid anteroseptal location.  This is an intermediate risk study.  The area of perfusion defect is small in the anteroseptal wall. The small mild defect  is below the RV insertion point.  Findings consistent with ischemia.      Recent Labs: 07/06/2019: BUN 17; Creatinine, Ser 1.18; Potassium 4.4; Sodium 144  Recent Lipid Panel No results found for: CHOL, TRIG, HDL, CHOLHDL, VLDL, LDLCALC, LDLDIRECT  Physical Exam:    VS:  BP (!) 152/90 (BP Location: Left Arm, Patient Position: Sitting, Cuff Size: Normal)   Pulse 71   Ht 5\' 3"  (1.6 m)   Wt 192 lb (87.1 kg)   SpO2 98%   BMI 34.01 kg/m     Wt Readings from Last 3 Encounters:  07/14/19 192 lb (87.1 kg)  06/11/19 194 lb (88 kg)  06/01/19 192 lb (87.1 kg)     GEN: Patient is in no acute distress HEENT: Normal NECK: No JVD; No carotid bruits LYMPHATICS: No lymphadenopathy CARDIAC: Hear sounds regular, 2/6 systolic murmur at the apex. RESPIRATORY:  Clear to auscultation without rales, wheezing or rhonchi  ABDOMEN: Soft, non-tender, non-distended MUSCULOSKELETAL:  No edema; No deformity  SKIN: Warm and dry NEUROLOGIC:  Alert and oriented x 3 PSYCHIATRIC:  Normal affect   Signed, Jenean Lindau, MD  07/14/2019 10:18 AM    [facility]

## 2019-07-15 ENCOUNTER — Telehealth: Payer: Self-pay

## 2019-07-15 ENCOUNTER — Ambulatory Visit: Payer: Medicare Other | Admitting: Cardiology

## 2019-07-15 LAB — BASIC METABOLIC PANEL
BUN/Creatinine Ratio: 13 (ref 12–28)
BUN: 16 mg/dL (ref 8–27)
CO2: 22 mmol/L (ref 20–29)
Calcium: 10 mg/dL (ref 8.7–10.3)
Chloride: 105 mmol/L (ref 96–106)
Creatinine, Ser: 1.25 mg/dL — ABNORMAL HIGH (ref 0.57–1.00)
GFR calc Af Amer: 47 mL/min/{1.73_m2} — ABNORMAL LOW (ref >59)
GFR calc non Af Amer: 41 mL/min/{1.73_m2} — ABNORMAL LOW (ref >59)
Glucose: 150 mg/dL — ABNORMAL HIGH (ref 65–99)
Potassium: 4.2 mmol/L (ref 3.5–5.2)
Sodium: 141 mmol/L (ref 134–144)

## 2019-07-15 LAB — CBC
Hematocrit: 40 % (ref 34.0–46.6)
Hemoglobin: 13.3 g/dL (ref 11.1–15.9)
MCH: 30.6 pg (ref 26.6–33.0)
MCHC: 33.3 g/dL (ref 31.5–35.7)
MCV: 92 fL (ref 79–97)
Platelets: 165 10*3/uL (ref 150–450)
RBC: 4.34 x10E6/uL (ref 3.77–5.28)
RDW: 12.9 % (ref 11.7–15.4)
WBC: 8.4 10*3/uL (ref 3.4–10.8)

## 2019-07-15 NOTE — Telephone Encounter (Signed)
-----   Message from Jenean Lindau, MD sent at 07/15/2019  8:17 AM EST ----- The results of the study is unremarkable.  Please inform Cath Lab and also tell the patient to be well-hydrated a day or 2 before the procedure because of renal insufficiency.  Please speak to the granddaughter.  Thank you please inform patient. I will discuss in detail at next appointment. Cc  primary care/referring physician Jenean Lindau, MD 07/15/2019 8:16 AM

## 2019-07-15 NOTE — Telephone Encounter (Signed)
Patient informed of lab results and was told yesterday that she would be scheduled for 7 AM appt for prehydration prior to cath. RN spoke with Earnest Bailey (pt Grand-daughter) and reminded her. No further questions at this time. Copy sen tot Dr. Lin Landsman.

## 2019-07-16 ENCOUNTER — Inpatient Hospital Stay (HOSPITAL_COMMUNITY): Admission: RE | Admit: 2019-07-16 | Payer: Medicare Other | Source: Ambulatory Visit

## 2019-07-17 ENCOUNTER — Other Ambulatory Visit (HOSPITAL_COMMUNITY)
Admission: RE | Admit: 2019-07-17 | Discharge: 2019-07-17 | Disposition: A | Discharge status: Home / Self Care | Payer: Medicare Other | Source: Ambulatory Visit | Attending: Interventional Cardiology | Admitting: Interventional Cardiology

## 2019-07-17 DIAGNOSIS — Z01812 Encounter for preprocedural laboratory examination: Secondary | ICD-10-CM | POA: Diagnosis not present

## 2019-07-17 DIAGNOSIS — Z20828 Contact with and (suspected) exposure to other viral communicable diseases: Secondary | ICD-10-CM | POA: Insufficient documentation

## 2019-07-18 LAB — NOVEL CORONAVIRUS, NAA (HOSP ORDER, SEND-OUT TO REF LAB; TAT 18-24 HRS): SARS-CoV-2, NAA: NOT DETECTED

## 2019-07-21 ENCOUNTER — Telehealth: Payer: Self-pay | Admitting: *Deleted

## 2019-07-21 NOTE — Telephone Encounter (Signed)
I reviewed procedure instructions with patient's granddaughter, Earnest Bailey.

## 2019-07-21 NOTE — Telephone Encounter (Addendum)
Pt contacted pre-catheterization scheduled at [facility] for: Thursday July 22, 2019 12 noon Verified arrival time and place: [facility]) at 7 AM pre procedure hydration  No solid food after midnight prior to cath, clear liquids until 5 AM day of procedure. Contrast allergy: no  Hold: Accupril-day before and day of procedure-GFR 41-pt had taken today 1/2 usual Insulin HS prior to procedure. 1/2 usual Victoza PM prior to procedure. Pt states she does not take AM Insulin. Pt is currently not take lasix.  AM meds can be  taken pre-cath with sip of water including: ASA 81 mg   Confirmed patient has responsible adult to drive home post procedure and observe 24 hours after arriving home: yes  Currently, due to Covid-19 pandemic, only one support person will be allowed with patient. Must be the same support person for that patient's entire stay, will be screened and required to wear a mask. They will be asked to wait in the waiting room for the duration of the patient's stay.  Patients are required to wear a mask when they enter the hospital.     COVID-19 Pre-Screening Questions:  . In the past 7 to 10 days have you had a cough,  shortness of breath, headache, congestion, fever (100 or greater) body aches, chills, sore throat, or sudden loss of taste or sense of smell? no . Have you been around anyone with known Covid 19? no . Have you been around anyone who is awaiting Covid 19 test results in the past 7 to 10 days? no  . Have you been around anyone who has been exposed to Covid 19, or has mentioned symptoms of Covid 19 within the past 7 to 10 days? no   I reviewed procedure/mask/visitor instructions, Covid-19 screening questions with patient, she verbalized understanding, thanked me for call. I left a message for her granddaughter, Earnest Bailey, to review instructions with her at patient's request.

## 2019-07-22 ENCOUNTER — Observation Stay (HOSPITAL_COMMUNITY)
Admission: RE | Admit: 2019-07-22 | Discharge: 2019-07-23 | Disposition: A | Discharge status: Home / Self Care | Payer: Medicare Other | Attending: Interventional Cardiology | Admitting: Interventional Cardiology

## 2019-07-22 ENCOUNTER — Encounter (HOSPITAL_COMMUNITY)
Admission: RE | Disposition: A | Discharge status: Home / Self Care | Payer: Self-pay | Source: Home / Self Care | Attending: Interventional Cardiology

## 2019-07-22 DIAGNOSIS — Z79899 Other long term (current) drug therapy: Secondary | ICD-10-CM | POA: Diagnosis not present

## 2019-07-22 DIAGNOSIS — I25810 Atherosclerosis of coronary artery bypass graft(s) without angina pectoris: Secondary | ICD-10-CM | POA: Diagnosis not present

## 2019-07-22 DIAGNOSIS — E088 Diabetes mellitus due to underlying condition with unspecified complications: Secondary | ICD-10-CM

## 2019-07-22 DIAGNOSIS — Z888 Allergy status to other drugs, medicaments and biological substances status: Secondary | ICD-10-CM | POA: Diagnosis not present

## 2019-07-22 DIAGNOSIS — I259 Chronic ischemic heart disease, unspecified: Secondary | ICD-10-CM | POA: Insufficient documentation

## 2019-07-22 DIAGNOSIS — Z6834 Body mass index (BMI) 34.0-34.9, adult: Secondary | ICD-10-CM | POA: Diagnosis not present

## 2019-07-22 DIAGNOSIS — E1122 Type 2 diabetes mellitus with diabetic chronic kidney disease: Secondary | ICD-10-CM | POA: Diagnosis not present

## 2019-07-22 DIAGNOSIS — Z880 Allergy status to penicillin: Secondary | ICD-10-CM | POA: Insufficient documentation

## 2019-07-22 DIAGNOSIS — Z951 Presence of aortocoronary bypass graft: Secondary | ICD-10-CM

## 2019-07-22 DIAGNOSIS — Z794 Long term (current) use of insulin: Secondary | ICD-10-CM | POA: Insufficient documentation

## 2019-07-22 DIAGNOSIS — I10 Essential (primary) hypertension: Secondary | ICD-10-CM

## 2019-07-22 DIAGNOSIS — E669 Obesity, unspecified: Secondary | ICD-10-CM | POA: Insufficient documentation

## 2019-07-22 DIAGNOSIS — R931 Abnormal findings on diagnostic imaging of heart and coronary circulation: Secondary | ICD-10-CM

## 2019-07-22 DIAGNOSIS — Z955 Presence of coronary angioplasty implant and graft: Secondary | ICD-10-CM

## 2019-07-22 DIAGNOSIS — I129 Hypertensive chronic kidney disease with stage 1 through stage 4 chronic kidney disease, or unspecified chronic kidney disease: Secondary | ICD-10-CM | POA: Diagnosis not present

## 2019-07-22 DIAGNOSIS — E785 Hyperlipidemia, unspecified: Secondary | ICD-10-CM | POA: Diagnosis not present

## 2019-07-22 DIAGNOSIS — I2510 Atherosclerotic heart disease of native coronary artery without angina pectoris: Secondary | ICD-10-CM | POA: Diagnosis not present

## 2019-07-22 DIAGNOSIS — E7800 Pure hypercholesterolemia, unspecified: Secondary | ICD-10-CM | POA: Insufficient documentation

## 2019-07-22 DIAGNOSIS — Z01810 Encounter for preprocedural cardiovascular examination: Secondary | ICD-10-CM

## 2019-07-22 DIAGNOSIS — Z7982 Long term (current) use of aspirin: Secondary | ICD-10-CM | POA: Insufficient documentation

## 2019-07-22 DIAGNOSIS — N189 Chronic kidney disease, unspecified: Secondary | ICD-10-CM

## 2019-07-22 DIAGNOSIS — Z7902 Long term (current) use of antithrombotics/antiplatelets: Secondary | ICD-10-CM | POA: Diagnosis not present

## 2019-07-22 DIAGNOSIS — R9439 Abnormal result of other cardiovascular function study: Secondary | ICD-10-CM

## 2019-07-22 DIAGNOSIS — N1831 Chronic kidney disease, stage 3a: Secondary | ICD-10-CM | POA: Insufficient documentation

## 2019-07-22 HISTORY — PX: LEFT HEART CATH AND CORS/GRAFTS ANGIOGRAPHY: CATH118250

## 2019-07-22 HISTORY — DX: Abnormal result of other cardiovascular function study: R94.39

## 2019-07-22 HISTORY — PX: CORONARY STENT INTERVENTION: CATH118234

## 2019-07-22 LAB — HEMOGLOBIN A1C
Hgb A1c MFr Bld: 8.5 % — ABNORMAL HIGH (ref 4.8–5.6)
Mean Plasma Glucose: 197.25 mg/dL

## 2019-07-22 LAB — GLUCOSE, CAPILLARY
Glucose-Capillary: 191 mg/dL — ABNORMAL HIGH (ref 70–99)
Glucose-Capillary: 120 mg/dL — ABNORMAL HIGH (ref 70–99)
Glucose-Capillary: 144 mg/dL — ABNORMAL HIGH (ref 70–99)
Glucose-Capillary: 291 mg/dL — ABNORMAL HIGH (ref 70–99)

## 2019-07-22 LAB — POCT ACTIVATED CLOTTING TIME
Activated Clotting Time: 279 seconds
Activated Clotting Time: 439 seconds
Activated Clotting Time: 213 seconds
Activated Clotting Time: 186 seconds

## 2019-07-22 SURGERY — LEFT HEART CATH AND CORS/GRAFTS ANGIOGRAPHY
Anesthesia: LOCAL

## 2019-07-22 MED ORDER — HYDRALAZINE HCL 20 MG/ML IJ SOLN
INTRAMUSCULAR | Status: AC
  Filled 2019-07-22: qty 1

## 2019-07-22 MED ORDER — SODIUM CHLORIDE 0.9% FLUSH: 3.0000 mL | INTRAVENOUS | Status: DC | PRN

## 2019-07-22 MED ORDER — LIDOCAINE HCL (PF) 1 % IJ SOLN
INTRAMUSCULAR | Status: AC
  Filled 2019-07-22: qty 30

## 2019-07-22 MED ORDER — HEPARIN SODIUM (PORCINE) 1000 UNIT/ML IJ SOLN
INTRAMUSCULAR | Status: AC
  Filled 2019-07-22: qty 1

## 2019-07-22 MED ORDER — AMLODIPINE BESYLATE 5 MG PO TABS
5.0000 mg | ORAL_TABLET | Freq: Every day | ORAL | Status: DC
  Administered 2019-07-22: 5 mg via ORAL
  Filled 2019-07-22: qty 1

## 2019-07-22 MED ORDER — HEPARIN (PORCINE) IN NACL 1000-0.9 UT/500ML-% IV SOLN
INTRAVENOUS | Status: AC
  Filled 2019-07-22: qty 1000

## 2019-07-22 MED ORDER — HEPARIN (PORCINE) IN NACL 1000-0.9 UT/500ML-% IV SOLN
INTRAVENOUS | Status: DC | PRN
  Administered 2019-07-22 (×3): 500 mL

## 2019-07-22 MED ORDER — SODIUM CHLORIDE 0.9 % WEIGHT BASED INFUSION: 1.0000 mL/kg/h | INTRAVENOUS | Status: DC

## 2019-07-22 MED ORDER — CLOPIDOGREL BISULFATE 75 MG PO TABS
75.0000 mg | ORAL_TABLET | Freq: Every day | ORAL | Status: DC
  Administered 2019-07-23: 09:00:00 75 mg via ORAL
  Filled 2019-07-22: qty 1

## 2019-07-22 MED ORDER — ASPIRIN EC 81 MG PO TBEC
81.0000 mg | DELAYED_RELEASE_TABLET | Freq: Every day | ORAL | Status: DC
  Administered 2019-07-23: 81 mg via ORAL
  Filled 2019-07-22: qty 1

## 2019-07-22 MED ORDER — INSULIN ASPART 100 UNIT/ML ~~LOC~~ SOLN
0.0000 [IU] | Freq: Three times a day (TID) | SUBCUTANEOUS | Status: DC
  Administered 2019-07-23: 09:00:00 5 [IU] via SUBCUTANEOUS
  Administered 2019-07-23: 12:00:00 1 [IU] via SUBCUTANEOUS

## 2019-07-22 MED ORDER — ASPIRIN 81 MG PO CHEW: 81.0000 mg | CHEWABLE_TABLET | ORAL | Status: DC

## 2019-07-22 MED ORDER — LABETALOL HCL 5 MG/ML IV SOLN: 10.0000 mg | INTRAVENOUS | Status: AC | PRN

## 2019-07-22 MED ORDER — NITROGLYCERIN 0.4 MG SL SUBL: 0.4000 mg | SUBLINGUAL_TABLET | SUBLINGUAL | Status: DC | PRN

## 2019-07-22 MED ORDER — MIDAZOLAM HCL 2 MG/2ML IJ SOLN
INTRAMUSCULAR | Status: DC | PRN
  Administered 2019-07-22 (×3): 1 mg via INTRAVENOUS

## 2019-07-22 MED ORDER — SODIUM CHLORIDE 0.9% FLUSH: 3.0000 mL | Freq: Two times a day (BID) | INTRAVENOUS | Status: DC

## 2019-07-22 MED ORDER — CLOPIDOGREL BISULFATE 300 MG PO TABS
ORAL_TABLET | ORAL | Status: AC
  Filled 2019-07-22: qty 2

## 2019-07-22 MED ORDER — SODIUM CHLORIDE 0.9% FLUSH
3.0000 mL | Freq: Two times a day (BID) | INTRAVENOUS | Status: DC
  Administered 2019-07-22 – 2019-07-23 (×2): 3 mL via INTRAVENOUS

## 2019-07-22 MED ORDER — HYDRALAZINE HCL 20 MG/ML IJ SOLN
INTRAMUSCULAR | Status: DC | PRN
  Administered 2019-07-22 (×2): 10 mg via INTRAVENOUS

## 2019-07-22 MED ORDER — IOHEXOL 350 MG/ML SOLN
INTRAVENOUS | Status: DC | PRN
  Administered 2019-07-22: 95 mL

## 2019-07-22 MED ORDER — ONDANSETRON HCL 4 MG/2ML IJ SOLN
INTRAMUSCULAR | Status: AC
  Filled 2019-07-22: qty 2

## 2019-07-22 MED ORDER — LIDOCAINE HCL (PF) 1 % IJ SOLN
INTRAMUSCULAR | Status: DC | PRN
  Administered 2019-07-22: 20 mL

## 2019-07-22 MED ORDER — MIDAZOLAM HCL 2 MG/2ML IJ SOLN
INTRAMUSCULAR | Status: AC
  Filled 2019-07-22: qty 2

## 2019-07-22 MED ORDER — SODIUM CHLORIDE 0.9 % IV SOLN: INTRAVENOUS | Status: AC

## 2019-07-22 MED ORDER — SODIUM CHLORIDE 0.9 % IV SOLN: 250.0000 mL | INTRAVENOUS | Status: DC | PRN

## 2019-07-22 MED ORDER — ONDANSETRON HCL 4 MG/2ML IJ SOLN
4.0000 mg | Freq: Four times a day (QID) | INTRAMUSCULAR | Status: DC | PRN
  Administered 2019-07-22: 4 mg via INTRAVENOUS

## 2019-07-22 MED ORDER — QUINAPRIL HCL 10 MG PO TABS
40.0000 mg | ORAL_TABLET | Freq: Every day | ORAL | Status: DC
  Administered 2019-07-22 – 2019-07-23 (×2): 40 mg via ORAL
  Filled 2019-07-22 (×2): qty 4

## 2019-07-22 MED ORDER — CLOPIDOGREL BISULFATE 300 MG PO TABS
ORAL_TABLET | ORAL | Status: DC | PRN
  Administered 2019-07-22: 600 mg via ORAL

## 2019-07-22 MED ORDER — ASPIRIN 81 MG PO CHEW: 81.0000 mg | CHEWABLE_TABLET | Freq: Every day | ORAL | Status: DC

## 2019-07-22 MED ORDER — NITROGLYCERIN 1 MG/10 ML FOR IR/CATH LAB
INTRA_ARTERIAL | Status: DC | PRN
  Administered 2019-07-22 (×2): 200 ug via INTRACORONARY

## 2019-07-22 MED ORDER — SODIUM CHLORIDE 0.9 % WEIGHT BASED INFUSION
3.0000 mL/kg/h | INTRAVENOUS | Status: DC
  Administered 2019-07-22: 08:00:00 3 mL/kg/h via INTRAVENOUS

## 2019-07-22 MED ORDER — FENTANYL CITRATE (PF) 100 MCG/2ML IJ SOLN
INTRAMUSCULAR | Status: DC | PRN
  Administered 2019-07-22 (×3): 25 ug via INTRAVENOUS

## 2019-07-22 MED ORDER — CLONIDINE HCL 0.1 MG PO TABS
0.1000 mg | ORAL_TABLET | Freq: Every day | ORAL | Status: DC
  Administered 2019-07-22: 22:00:00 0.1 mg via ORAL
  Filled 2019-07-22: qty 1

## 2019-07-22 MED ORDER — ROSUVASTATIN CALCIUM 5 MG PO TABS
5.0000 mg | ORAL_TABLET | ORAL | Status: DC
  Administered 2019-07-22: 22:00:00 5 mg via ORAL
  Filled 2019-07-22 (×2): qty 1

## 2019-07-22 MED ORDER — CARVEDILOL 25 MG PO TABS
25.0000 mg | ORAL_TABLET | Freq: Two times a day (BID) | ORAL | Status: DC
  Administered 2019-07-22 – 2019-07-23 (×2): 25 mg via ORAL
  Filled 2019-07-22 (×2): qty 1

## 2019-07-22 MED ORDER — ACETAMINOPHEN 325 MG PO TABS: 650.0000 mg | ORAL_TABLET | ORAL | Status: DC | PRN

## 2019-07-22 MED ORDER — HYDRALAZINE HCL 20 MG/ML IJ SOLN: 10.0000 mg | INTRAMUSCULAR | Status: AC | PRN

## 2019-07-22 MED ORDER — NITROGLYCERIN 1 MG/10 ML FOR IR/CATH LAB
INTRA_ARTERIAL | Status: AC
  Filled 2019-07-22: qty 10

## 2019-07-22 MED ORDER — HEPARIN SODIUM (PORCINE) 1000 UNIT/ML IJ SOLN
INTRAMUSCULAR | Status: DC | PRN
  Administered 2019-07-22: 10000 [IU] via INTRAVENOUS
  Administered 2019-07-22: 3000 [IU] via INTRAVENOUS

## 2019-07-22 MED ORDER — INSULIN GLARGINE 100 UNIT/ML ~~LOC~~ SOLN
30.0000 [IU] | Freq: Every day | SUBCUTANEOUS | Status: DC
  Administered 2019-07-22: 22:00:00 30 [IU] via SUBCUTANEOUS
  Filled 2019-07-22 (×2): qty 0.3

## 2019-07-22 MED ORDER — HEPARIN (PORCINE) IN NACL 1000-0.9 UT/500ML-% IV SOLN
INTRAVENOUS | Status: AC
  Filled 2019-07-22: qty 500

## 2019-07-22 MED ORDER — FENTANYL CITRATE (PF) 100 MCG/2ML IJ SOLN
INTRAMUSCULAR | Status: AC
  Filled 2019-07-22: qty 2

## 2019-07-22 SURGICAL SUPPLY — 18 items
BALLN SAPPHIRE 2.0X20 (BALLOONS) ×2
BALLN SAPPHIRE ~~LOC~~ 2.25X15 (BALLOONS) ×1 IMPLANT
BALLOON SAPPHIRE 2.0X20 (BALLOONS) IMPLANT
CATH DXT MULTI JL4 JR4 ANG PIG (CATHETERS) ×1 IMPLANT
CATH LAUNCHER 6FR EBU 3.75 (CATHETERS) ×1 IMPLANT
KIT ENCORE 26 ADVANTAGE (KITS) ×1 IMPLANT
KIT HEART LEFT (KITS) ×2 IMPLANT
KIT HEMO VALVE WATCHDOG (MISCELLANEOUS) ×1 IMPLANT
PACK CARDIAC CATHETERIZATION (CUSTOM PROCEDURE TRAY) ×2 IMPLANT
SHEATH PINNACLE 5F 10CM (SHEATH) ×1 IMPLANT
SHEATH PINNACLE 6F 10CM (SHEATH) ×1 IMPLANT
SHEATH PROBE COVER 6X72 (BAG) ×1 IMPLANT
STENT RESOLUTE ONYX 2.0X22 (Permanent Stent) ×1 IMPLANT
STENT RESOLUTE ONYX 2.0X26 (Permanent Stent) ×1 IMPLANT
TRANSDUCER W/STOPCOCK (MISCELLANEOUS) ×2 IMPLANT
TUBING CIL FLEX 10 FLL-RA (TUBING) ×2 IMPLANT
WIRE ASAHI PROWATER 180CM (WIRE) ×1 IMPLANT
WIRE EMERALD 3MM-J .035X150CM (WIRE) ×1 IMPLANT

## 2019-07-22 NOTE — Interval H&P Note (Signed)
Cath Lab Visit (complete for each Cath Lab visit)  Clinical Evaluation Leading to the Procedure:   ACS: No.  Non-ACS:    Anginal Classification: CCS II  Anti-ischemic medical therapy: Minimal Therapy (1 class of medications)  Non-Invasive Test Results: Intermediate-risk stress test findings: cardiac mortality 1-3%/year  Prior CABG: Previous CABG      History and Physical Interval Note:  07/22/2019 12:53 PM  Alyssa Cooper  has presented today for surgery, with the diagnosis of Angina.  The various methods of treatment have been discussed with the patient and family. After consideration of risks, benefits and other options for treatment, the patient has consented to  Procedure(s): LEFT HEART CATH AND CORS/GRAFTS ANGIOGRAPHY (N/A) as a surgical intervention.  The patient's history has been reviewed, patient examined, no change in status, stable for surgery.  I have reviewed the patient's chart and labs.  Questions were answered to the patient's satisfaction.     Larae Grooms

## 2019-07-22 NOTE — Progress Notes (Addendum)
Site area: Left groin a 6 french arterial sheath was removed  Site Prior to Removal:  Level 0  Pressure Applied For 30 MINUTES    Bedrest Beginning at 1740p  Manual:   yes  Patient Status During Pull:  stable  Post Pull Groin Site:  Level 0  Post Pull Instructions Given:  Yes.    Post Pull Pulses Present:  Yes.    Dressing Applied:  Yes.    Comments:

## 2019-07-23 ENCOUNTER — Encounter (HOSPITAL_COMMUNITY): Payer: Self-pay | Admitting: Interventional Cardiology

## 2019-07-23 DIAGNOSIS — Z794 Long term (current) use of insulin: Secondary | ICD-10-CM | POA: Diagnosis not present

## 2019-07-23 DIAGNOSIS — I25810 Atherosclerosis of coronary artery bypass graft(s) without angina pectoris: Secondary | ICD-10-CM | POA: Diagnosis not present

## 2019-07-23 DIAGNOSIS — Z888 Allergy status to other drugs, medicaments and biological substances status: Secondary | ICD-10-CM | POA: Diagnosis not present

## 2019-07-23 DIAGNOSIS — R9439 Abnormal result of other cardiovascular function study: Secondary | ICD-10-CM | POA: Diagnosis not present

## 2019-07-23 DIAGNOSIS — E785 Hyperlipidemia, unspecified: Secondary | ICD-10-CM | POA: Diagnosis not present

## 2019-07-23 DIAGNOSIS — E7800 Pure hypercholesterolemia, unspecified: Secondary | ICD-10-CM | POA: Diagnosis not present

## 2019-07-23 DIAGNOSIS — E1122 Type 2 diabetes mellitus with diabetic chronic kidney disease: Secondary | ICD-10-CM | POA: Diagnosis not present

## 2019-07-23 DIAGNOSIS — I129 Hypertensive chronic kidney disease with stage 1 through stage 4 chronic kidney disease, or unspecified chronic kidney disease: Secondary | ICD-10-CM | POA: Diagnosis not present

## 2019-07-23 DIAGNOSIS — Z9861 Coronary angioplasty status: Secondary | ICD-10-CM

## 2019-07-23 DIAGNOSIS — Z6834 Body mass index (BMI) 34.0-34.9, adult: Secondary | ICD-10-CM | POA: Diagnosis not present

## 2019-07-23 DIAGNOSIS — Z7982 Long term (current) use of aspirin: Secondary | ICD-10-CM | POA: Diagnosis not present

## 2019-07-23 DIAGNOSIS — Z880 Allergy status to penicillin: Secondary | ICD-10-CM | POA: Diagnosis not present

## 2019-07-23 DIAGNOSIS — I259 Chronic ischemic heart disease, unspecified: Secondary | ICD-10-CM | POA: Diagnosis not present

## 2019-07-23 DIAGNOSIS — I2510 Atherosclerotic heart disease of native coronary artery without angina pectoris: Secondary | ICD-10-CM

## 2019-07-23 DIAGNOSIS — Z79899 Other long term (current) drug therapy: Secondary | ICD-10-CM | POA: Diagnosis not present

## 2019-07-23 DIAGNOSIS — N1831 Chronic kidney disease, stage 3a: Secondary | ICD-10-CM | POA: Diagnosis not present

## 2019-07-23 DIAGNOSIS — E669 Obesity, unspecified: Secondary | ICD-10-CM | POA: Diagnosis not present

## 2019-07-23 DIAGNOSIS — Z7902 Long term (current) use of antithrombotics/antiplatelets: Secondary | ICD-10-CM | POA: Diagnosis not present

## 2019-07-23 HISTORY — DX: Atherosclerotic heart disease of native coronary artery without angina pectoris: I25.10

## 2019-07-23 HISTORY — DX: Coronary angioplasty status: Z98.61

## 2019-07-23 LAB — BASIC METABOLIC PANEL
Anion gap: 11 (ref 5–15)
BUN: 14 mg/dL (ref 8–23)
CO2: 22 mmol/L (ref 22–32)
Calcium: 9.2 mg/dL (ref 8.9–10.3)
Chloride: 108 mmol/L (ref 98–111)
Creatinine, Ser: 1.29 mg/dL — ABNORMAL HIGH (ref 0.44–1.00)
GFR calc Af Amer: 45 mL/min — ABNORMAL LOW (ref >60)
GFR calc non Af Amer: 39 mL/min — ABNORMAL LOW (ref >60)
Glucose, Bld: 209 mg/dL — ABNORMAL HIGH (ref 70–99)
Potassium: 3.9 mmol/L (ref 3.5–5.1)
Sodium: 141 mmol/L (ref 135–145)

## 2019-07-23 LAB — LIPID PANEL
Cholesterol: 206 mg/dL — ABNORMAL HIGH (ref 0–200)
HDL: 46 mg/dL (ref >40)
LDL Cholesterol: 133 mg/dL — ABNORMAL HIGH (ref 0–99)
Total CHOL/HDL Ratio: 4.5 RATIO
Triglycerides: 135 mg/dL (ref <150)
VLDL: 27 mg/dL (ref 0–40)

## 2019-07-23 LAB — CBC
HCT: 35 % — ABNORMAL LOW (ref 36.0–46.0)
Hemoglobin: 11.6 g/dL — ABNORMAL LOW (ref 12.0–15.0)
MCH: 30 pg (ref 26.0–34.0)
MCHC: 33.1 g/dL (ref 30.0–36.0)
MCV: 90.4 fL (ref 80.0–100.0)
Platelets: 163 10*3/uL (ref 150–400)
RBC: 3.87 MIL/uL (ref 3.87–5.11)
RDW: 13.7 % (ref 11.5–15.5)
WBC: 7.8 10*3/uL (ref 4.0–10.5)
nRBC: 0 % (ref 0.0–0.2)

## 2019-07-23 LAB — GLUCOSE, CAPILLARY
Glucose-Capillary: 208 mg/dL — ABNORMAL HIGH (ref 70–99)
Glucose-Capillary: 126 mg/dL — ABNORMAL HIGH (ref 70–99)

## 2019-07-23 MED ORDER — CLOPIDOGREL BISULFATE 75 MG PO TABS: 75.0000 mg | ORAL_TABLET | Freq: Every day | ORAL | 6 refills | Status: DC

## 2019-07-23 MED ORDER — AMLODIPINE BESYLATE 10 MG PO TABS: 10.0000 mg | ORAL_TABLET | Freq: Every day | ORAL | 3 refills | Status: DC

## 2019-07-23 MED ORDER — AMLODIPINE BESYLATE 10 MG PO TABS
10.0000 mg | ORAL_TABLET | Freq: Every day | ORAL | Status: DC
  Administered 2019-07-23: 09:00:00 10 mg via ORAL
  Filled 2019-07-23: qty 1

## 2019-07-23 MED FILL — CLOPIDOGREL 75 MG TABLET: 75 | 30 days supply | Qty: 30 | Fill #0

## 2019-07-23 MED FILL — AMLODIPINE BESYLATE 10 MG T: 10 | 30 days supply | Qty: 30 | Fill #0

## 2019-07-23 NOTE — Plan of Care (Signed)
Pt discharged to home with self care, discharge education complete, left facility via private vehicle.

## 2019-07-23 NOTE — Discharge Summary (Signed)
Discharge Summary    Patient ID: [name] MRN: [id number]; DOB: 14-May-1941  Admit date: 07/22/2019 Discharge date: 07/23/2019  Primary Care Provider: Angelina Sheriff, MD  Primary Cardiologist: Jenean Lindau, MD   Discharge Diagnoses    Principal Problem:   CAD S/P percutaneous coronary angioplasty Active Problems:   Type 2 diabetes mellitus with diabetic chronic kidney disease ([place])   Chronic kidney disease   Chronic ischemic heart disease   Hypertension   Hx of CABG   Abnormal stress test  Diagnostic Studies/Procedures    LHC 07/22/2019:   Mid RCA lesion is 100% stenosed. SVG to RCA is occluded.  Mid Cx lesion is 75% stenosed. SVG to OM is occluded.  A drug-eluting stent was successfully placed using a STENT RESOLUTE ONYX 2.0X22.  Post intervention, there is a 0% residual stenosis.  Ramus lesion is 75% stenosed.  A drug-eluting stent was successfully placed using a STENT RESOLUTE ONYX 2.0X26.  Post intervention, there is a 0% residual stenosis.  Mid LAD lesion is 100% stenosed. LIMA to LAD.  2nd Diag lesion is 80% stenosed. This is a small diffusely diseased vessel.  LV end diastolic pressure is normal.  There is no aortic valve stenosis.  Continue aggressive secondary prevention. Watch overnight. Hydration for renal disease.   Shoulder surgery will have to be delayed, ideally for a few months.   History of Present Illness     Alyssa Cooper is a 78 y.o. female with a history of CABG, DM 2, CKD, hypertension, HLD and chronic ischemic heart disease who was seen by Dr. Geraldo Pitter on 07/14/2019 for preoperative exam for shoulder surgery.  Patient found to have a abnormal Lexiscan stress test in which she was scheduled for coronary CT however given the nature of her CABG surgery and renal insufficiency, LHC was preferred.  Hospital Course   Cath performed 07/22/2019 which revealed a mid LCx lesion at 75%, ramus lesion at 75% in which PCI/DES stents  were placed.  Plan was to continue with aggressive secondary prevention.  She was held overnight for renal hydration.  Cath site is unremarkable.  No anginal symptoms or shortness of breath.  Ambulated without complication.  Hospital problems include:  1.  CAD s/p CABG with abnormal preoperative stress test: -Underwent LHC on 07/22/2019 by Dr. Irish Lack in which she had PCI/DES to mid LCx and ramus lesions.  Also noted to have occluded SVG to RCA, occluded SVG to OM, mid LAD lesion 100% stenosed, second diagonal lesion at 80% stenosed. Recommendations were for continued aggressive secondary prevention with overnight renal hydration. -Denies anginal symptoms -Cath site unremarkable -Continue carvedilol 25, ASA 81, Plavix 75, statin -Creatinine, 1.29 today slightly elevated from 07/14/2019 at 1.25  2.  CKD stage II: -As above, creatinine 1.29 today with a baseline in the 1.8-1.2 range -Continue to avoid nephrotoxic medications  3.  Hypertension: -Elevated, 163/712> 155/82 -Continue carvedilol 25, clonidine 0.1 mg -We will uptitrate amlodipine to 10 mg p.o. daily -May need further antihypertensive titration in the outpatient setting  4.  HLD: -Last LDL, 133 -Continue statin (takes Crestor 5mg , 2 times weekly) -Will need further titration of Crestor if tolerable  -Likely will need outpatient lipid clinic referral given statin intolerance  5.  DM2: -Hemoglobin A1c, 8.5 -Needs better control, followed by PCP -On home Victoza, Lantus  Consultants: None  The patient was seen and examined by Dr. Burt Knack who feels that she is stable and ready for discharge today, 07/23/2019.  Did  the patient have an acute coronary syndrome (MI, NSTEMI, STEMI, etc) this admission?:  No                               Did the patient have a percutaneous coronary intervention (stent / angioplasty)?:  Yes.     Cath/PCI Registry Performance & Quality Measures: 1. Aspirin prescribed? - Yes 2. ADP Receptor  Inhibitor (Plavix/Clopidogrel, Brilinta/Ticagrelor or Effient/Prasugrel) prescribed (includes medically managed patients)? - Yes 3. High Intensity Statin (Lipitor 40-80mg  or Crestor 20-40mg ) prescribed? - No - Statin intolerance 4. For EF <40%, was ACEI/ARB prescribed? - Not Applicable (EF >/= [id number]) 5. For EF <40%, Aldosterone Antagonist (Spironolactone or Eplerenone) prescribed? - Not Applicable (EF >/= [id number]) 6. Cardiac Rehab Phase II ordered (Included Medically managed Patients)? - Yes   _____________  Discharge Vitals Blood pressure 122/70, pulse 65, temperature 98.4 F (36.9 C), temperature source Oral, resp. rate 14, height 5\' 3"  (1.6 m), weight 86.2 kg, SpO2 97 %.  Filed Weights   07/22/19 0719 07/23/19 0618  Weight: 87.1 kg 86.2 kg    Labs & Radiologic Studies    CBC Recent Labs    07/23/19 0345  WBC 7.8  HGB 11.6*  HCT 35.0*  MCV 90.4  PLT [id number]   Basic Metabolic Panel Recent Labs    07/23/19 0345  NA 141  K 3.9  CL 108  CO2 22  GLUCOSE 209*  BUN 14  CREATININE 1.29*  CALCIUM 9.2   Hemoglobin A1C Recent Labs    07/22/19 1814  HGBA1C 8.5*   Fasting Lipid Panel Recent Labs    07/23/19 0934  CHOL 206*  HDL 46  LDLCALC 133*  TRIG 135  CHOLHDL 4.5   Thyroid Function Tests No results for input(s): TSH, T4TOTAL, T3FREE, THYROIDAB in the last 72 hours.  Invalid input(s): FREET3 _____________  No results found. Disposition   Pt is being discharged home today in good condition.  Follow-up Plans & Appointments   Follow-up Information    Revankar, Alyssa Cliche, MD Follow up on 08/13/2019.   Specialty: Cardiology Why: at 1040am Contact information: [address] [place] [address] [phone]          Discharge Instructions    AMB Referral to Advanced Lipid Disorders Clinic   Complete by: As directed    Internal Lipid Clinic Referral Scheduling  Internal lipid clinic referrals are providers within [facility], who wish to refer  established patients for routine management (help in starting PCSK9 inhibitor therapy) or advanced therapies.  Internal MD referral criteria:              1. All patients with LDL>190 mg/dL  2. All patients with Triglycerides >500 mg/dL  3. Patients with suspected or confirmed heterozygous familial hyperlipidemia (HeFH) or homozygous familial hyperlipidemia (HoFH)  4. Patients with family history of suspicious for genetic dyslipidemia desiring genetic testing  5. Patients refractory to standard guideline based therapy  6. Patients with statin intolerance (failed 2 statins, one of which must be a high potency statin)  7. Patients who the provider desires to be seen by MD   Internal PharmD referral criteria:   1. Follow-up patients for medication management  2. Follow-up for compliance monitoring  3. Patients for drug education  4. Patients with statin intolerance  5. PCSK9 inhibitor education and prior authorization approvals  6. Patients with triglycerides <500 mg/dL  External Lipid Clinic Referral  External lipid clinic  referrals are for providers outside of [facility], considered new clinic patients - automatically routed to MD schedule   Amb Referral to Cardiac Rehabilitation   Complete by: As directed    To [place]   Diagnosis:  Coronary Stents PTCA     After initial evaluation and assessments completed: Virtual Based Care may be provided alone or in conjunction with Phase 2 Cardiac Rehab based on patient barriers.: Yes     Discharge Medications   Allergies as of 07/23/2019      Reactions   Penicillins Hives   Did it involve swelling of the face/tongue/throat, SOB, or low BP? No Did it involve sudden or severe rash/hives, skin peeling, or any reaction on the inside of your mouth or nose? Yes Did you need to seek medical attention at a hospital or doctor's office? Yes When did it last happen?in her 71s? If all above answers are NO, may proceed with  cephalosporin use.   Atorvastatin Other (See Comments)   Joint pain   Livalo [pitavastatin]    Motrin [ibuprofen]    Nsaids    Tramadol    Zetia [ezetimibe]    Muscle aches    Bactrim [sulfamethoxazole-trimethoprim] Rash   Furosemide Rash      Medication List    TAKE these medications   amLODipine 10 MG tablet Commonly known as: NORVASC Take 1 tablet (10 mg total) by mouth daily. What changed:   medication strength  how much to take   aspirin EC 81 MG tablet Take 81 mg by mouth daily.   carvedilol 25 MG tablet Commonly known as: COREG Take 1 tablet by mouth 2 (two) times daily with a meal.   cloNIDine 0.1 MG tablet Commonly known as: CATAPRES Take 0.1 mg by mouth at bedtime.   clopidogrel 75 MG tablet Commonly known as: PLAVIX Take 1 tablet (75 mg total) by mouth daily with breakfast.   ergocalciferol 1.25 MG (50000 UT) capsule Commonly known as: VITAMIN D2 Take 50,000 Units by mouth 2 (two) times a week.   Lantus 100 UNIT/ML injection Generic drug: insulin glargine Inject 30 Units into the skin at bedtime.   nitroGLYCERIN 0.4 MG SL tablet Commonly known as: NITROSTAT Place 1 tablet (0.4 mg total) under the tongue every 5 (five) minutes as needed.   quinapril 40 MG tablet Commonly known as: ACCUPRIL Take 40 mg by mouth daily.   rosuvastatin 5 MG tablet Commonly known as: CRESTOR Take 5 mg by mouth 2 (two) times a week. At bedtime ONLY   Victoza 18 MG/3ML Sopn Generic drug: liraglutide Inject 1.8 mg into the skin at bedtime.       Outstanding Labs/Studies   Repeat BMET, Lipid   Duration of Discharge Encounter   Greater than 30 minutes including physician time.  Signed, Kathyrn Drown, NP 07/23/2019, 12:09 PM

## 2019-07-23 NOTE — Progress Notes (Addendum)
Progress Note  Patient Name: Alyssa Cooper Date of Encounter: 07/23/2019  Primary Cardiologist: Dr. Geraldo Pitter, MD  Subjective   Doing very well.  Denies chest pain or shortness of breath.  Cath site unremarkable  Inpatient Medications    Scheduled Meds: . amLODipine  5 mg Oral Daily  . aspirin EC  81 mg Oral Daily  . carvedilol  25 mg Oral BID WC  . cloNIDine  0.1 mg Oral QHS  . clopidogrel  75 mg Oral Q breakfast  . insulin aspart  0-15 Units Subcutaneous TID WC  . insulin glargine  30 Units Subcutaneous QHS  . quinapril  40 mg Oral Daily  . rosuvastatin  5 mg Oral 2 times weekly  . sodium chloride flush  3 mL Intravenous Q12H   Continuous Infusions: . sodium chloride     PRN Meds: sodium chloride, acetaminophen, nitroGLYCERIN, ondansetron (ZOFRAN) IV, sodium chloride flush   Vital Signs    Vitals:   07/22/19 2116 07/22/19 2134 07/22/19 2156 07/23/19 0618  BP: 126/60  (!) 163/71 122/70  Pulse: 73   65  Resp: 14     Temp:  98.9 F (37.2 C)  98.4 F (36.9 C)  TempSrc:  Oral  Oral  SpO2: 96%   97%  Weight:    86.2 kg  Height:        Intake/Output Summary (Last 24 hours) at 07/23/2019 0726 Last data filed at 07/23/2019 0500 Gross per 24 hour  Intake 3 ml  Output 1200 ml  Net -1197 ml   Filed Weights   07/22/19 0719 07/23/19 0618  Weight: 87.1 kg 86.2 kg    Physical Exam   General: Well developed, well nourished, NAD Lungs:Clear to ausculation bilaterally. No wheezes, rales, or rhonchi. Breathing is unlabored. Cardiovascular: RRR with S1 S2. No murmur Abdomen: Soft, non-tender, non-distended. No obvious abdominal masses. Extremities: No edema. No clubbing or cyanosis. DP pulses 2+ bilaterally Neuro: Alert and oriented. No focal deficits. No facial asymmetry. MAE spontaneously. Psych: Responds to questions appropriately with normal affect.    Labs    Chemistry Recent Labs  Lab 07/23/19 0345  NA 141  K 3.9  CL 108  CO2 22  GLUCOSE 209*   BUN 14  CREATININE 1.29*  CALCIUM 9.2  GFRNONAA 39*  GFRAA 45*  ANIONGAP 11     Hematology Recent Labs  Lab 07/23/19 0345  WBC 7.8  RBC 3.87  HGB 11.6*  HCT 35.0*  MCV 90.4  MCH 30.0  MCHC 33.1  RDW 13.7  PLT 163    Cardiac EnzymesNo results for input(s): TROPONINI in the last 168 hours. No results for input(s): TROPIPOC in the last 168 hours.   BNPNo results for input(s): BNP, PROBNP in the last 168 hours.   DDimer No results for input(s): DDIMER in the last 168 hours.   Radiology    No results found.  Telemetry    07/23/2019: NSR, HR 60s to 70s- Personally Reviewed  ECG    No new tracing as of 07/23/2019- Personally Reviewed  Cardiac Studies   LHC 07/22/2019:   Mid RCA lesion is 100% stenosed. SVG to RCA is occluded.  Mid Cx lesion is 75% stenosed. SVG to OM is occluded.  A drug-eluting stent was successfully placed using a STENT RESOLUTE ONYX 2.0X22.  Post intervention, there is a 0% residual stenosis.  Ramus lesion is 75% stenosed.  A drug-eluting stent was successfully placed using a STENT RESOLUTE ONYX 2.0X26.  Post  intervention, there is a 0% residual stenosis.  Mid LAD lesion is 100% stenosed. LIMA to LAD.  2nd Diag lesion is 80% stenosed. This is a small diffusely diseased vessel.  LV end diastolic pressure is normal.  There is no aortic valve stenosis.   Continue aggressive secondary prevention.  Watch overnight.  Hydration for renal disease.    Shoulder surgery will have to be delayed, ideally for a few months.   Lexiscan stress test 06/01/2019:   Study Highlights  Addendum by Berniece Salines, DO on Wed Jun 02, 2019 9:08 AM   The left ventricular ejection fraction is hyperdynamic (>65%).  Nuclear stress EF: 70%.  There was no ST segment deviation noted during stress.  Defect 1: There is a small defect of mild severity present in the mid anteroseptal location.  This is an intermediate risk study.  The area of perfusion  defect is small in the anteroseptal wall. The small mild defect is below the RV insertion point.  Findings consistent with ischemia.    Echocardiogram 05/31/2019:  1. Left ventricular ejection fraction, by visual estimation, is 55 to 60%. The left ventricle has normal function. Normal left ventricular size. There is moderately increased concentric left ventricular hypertrophy. 2. Left ventricular diastolic Doppler parameters are consistent with impaired relaxation pattern of LV diastolic filling (Grade I Diastolic Dysfunction). 3. Left atrial size was mildly dilated. 4. The aortic valve is tricuspid with leaflet calcification. There is mild to moderate aortic stenosis. AVA by VTI 1.24cmsq, mean gradient 81mmHG, peak velcoity 2.3 m/s, DI 0.43. There is no aortic regurgitation. 5. The mitral valve is normal in structure. Mild mitral valve regurgitation. No evidence of mitral stenosis. 6. There is mild dilatation of the ascending aorta measuring 38 mm. 7. Normal pulmonary artery systolic pressure.  Patient Profile     78 y.o. female with a history of CABG, DM 2, CKD, hypertension, HLD and chronic ischemic heart disease who was seen by Dr. Geraldo Pitter on 07/14/2019 for preoperative exam for shoulder surgery.  Patient found to have a abnormal Lexiscan stress test in which she was scheduled for coronary CT however given the nature of her CABG surgery and renal insufficiency, LHC was preferred.  Assessment & Plan    1.  CAD s/p CABG with abnormal preoperative stress test: -Underwent LHC on 07/22/2019 by Dr. Irish Lack in which she had PCI/DES to mid LCx and ramus lesions.  Also noted to have occluded SVG to RCA, occluded SVG to OM, mid LAD lesion 100% stenosed, second diagonal lesion at 80% stenosed.  Recommendations were for continued aggressive secondary prevention with overnight renal hydration. -Denies anginal symptoms -Cath site unremarkable -Continue carvedilol 25, ASA 81, Plavix 75, statin  -Creatinine, 1.29 today slightly elevated from 07/14/2019 at 1.25  2.  CKD stage II: -As above, creatinine 1.29 today with a baseline in the 1.8-1.2 range -Continue to avoid nephrotoxic medications  3.  Hypertension: -Elevated, 163/712> 155/82 -Continue carvedilol 25, clonidine 0.1 mg once daily -We will uptitrate amlodipine to 10 mg p.o. daily  4.  HLD: -Last LDL>>> not on file, repeat blood work -Continue statin (takes Crestor 5 2 times weekly) -Depending on lab results, may need further titration of Crestor -May need outpatient lipid clinic referral given statin intolerance  5.  DM2: -Hemoglobin A1c, 8.5 -Needs better control, followed by PCP -SSI for glucose control inpatient status  Signed, Kathyrn Drown NP-[facility] Pager: [phone] 07/23/2019, 7:26 AM     For questions or updates, please contact  Please consult [email or web address] for contact info under Cardiology/STEMI.  Patient seen, examined. Available data reviewed. Agree with findings, assessment, and plan as outlined by Kathyrn Drown, NP.  On exam she is alert and oriented in no distress.  Heart regular rate and rhythm no murmur gallop, lungs clear, right groin with ecchymoses but no firm hematoma.  Extremities with mild diffuse edema and chronic venous stasis changes.  I reviewed her cardiac catheterization films as she was treated with PCI of the circumflex and intermediate branches.  Seems to be doing well and is medically stable for discharge today.  Medical program reviewed as above with no changes recommended.  Hospital follow-up arranged.  Sherren Mocha, M.D. 07/23/2019 11:27 AM

## 2019-07-23 NOTE — Discharge Instructions (Signed)
Coronary Artery Disease, Female °Coronary artery disease (CAD) is a condition in which the arteries that lead to the heart (coronary arteries) become narrow or blocked. The narrowing or blockage can lead to decreased blood flow to the heart. Prolonged reduced blood flow can cause a heart attack (myocardial infarction or MI). This condition may also be called coronary heart disease. °Because CAD is the leading cause of death in women, it is important to understand what causes this condition and how it is treated. °What are the causes? °CAD is most often caused by atherosclerosis. This is the buildup of fat and cholesterol (plaque) on the inside of the arteries. Over time, the plaque may narrow or block the artery, reducing blood flow to the heart. Plaque can also become weak and break off within a coronary artery and cause a sudden blockage. Other less common causes of CAD include: °· A blood clot or a piece of a blood clot or other substance that blocks the flow of blood in a coronary artery (embolism). °· A tearing of the artery (spontaneous coronary artery dissection). °· An enlargement of an artery (aneurysm). °· Inflammation (vasculitis) in the artery wall. °What increases the risk? °The following factors may make you more likely to develop this condition: °· Age. Women over age 55 are at a greater risk of CAD. °· Family history of CAD. °· High blood pressure (hypertension). °· Diabetes. °· High cholesterol levels. °· Tobacco use. °· Lack of exercise. °· Menopause. °? All postmenopausal women are at greater risk of CAD. °? Women who have experienced menopause between the ages of 40-45 (early menopause) are at a higher risk of CAD. °? Women who have experienced menopause before age 40 (premature menopause) are at a very high risk of CAD. °· Excessive alcohol use. °· A diet high in saturated and trans fats, such as fried food and processed meat. °Other possible risk factors include: °· High stress  levels. °· Depression. °· Obesity. °· Sleep apnea. °What are the signs or symptoms? °Many people do not have any symptoms during the early stages of CAD. As the condition progresses, symptoms may include: °· Chest pain (angina). The pain can: °? Feel like crushing or squeezing, or like a tightness, pressure, fullness, or heaviness in the chest. °? Last more than a few minutes or can stop and recur. The pain tends to get worse with exercise or stress and to fade with rest. °· Pain in the arms, neck, jaw, ear, or back. °· Unexplained heartburn or indigestion. °· Shortness of breath. °· Nausea. °· Sudden cold sweats. °· Sudden light-headedness. °· Fluttering or fast heartbeat (palpitations). °Many women have chest discomfort and the other symptoms. However, women often have unusual (atypical) symptoms, such as: °· Fatigue. °· Vomiting. °· Unexplained feelings of nervousness or anxiety. °· Unexplained weakness. °· Dizziness or fainting. °How is this diagnosed? °This condition is diagnosed based on: °· Your family and medical history. °· A physical exam. °· Tests, including: °? A test to check the electrical signals in your heart (electrocardiogram). °? Exercise stress test. This looks for signs of blockage when the heart is stressed with exercise, such as running on a treadmill. °? Pharmacologic stress test. This test looks for signs of blockage when the heart is being stressed with a medicine. °? Blood tests. °? Coronary angiogram. This is a procedure to look at the coronary arteries to see if there is any blockage. During this test, a dye is injected into your arteries so they   appear on an X-ray. °? Coronary artery CT scan. This CT scan helps detect calcium deposits in your coronary arteries. Calcium deposits are an indicator of CAD. °? A test that uses sound waves to take a picture of your heart (echocardiogram). °? Chest X-ray. °How is this treated? °This condition may be treated by: °· Healthy lifestyle changes to  reduce risk factors. °· Medicines such as: °? Antiplatelet medicines and blood-thinning medicines, such as aspirin. These help to prevent blood clots. °? Nitroglycerin. °? Blood pressure medicines. °? Cholesterol-lowering medicine. °· Coronary angioplasty and stenting. During this procedure, a thin, flexible tube is inserted through a blood vessel and into a blocked artery. A balloon or similar device on the end of the tube is inflated to open up the artery. In some cases, a small, mesh tube (stent) is inserted into the artery to keep it open. °· Coronary artery bypass surgery. During this surgery, veins or arteries from other parts of the body are used to create a bypass around the blockage and allow blood to reach your heart. °Follow these instructions at home: °Medicines °· Take over-the-counter and prescription medicines only as told by your health care provider. °· Do not take the following medicines unless your health care provider approves: °? NSAIDs, such as ibuprofen, naproxen, or celecoxib. °? Vitamin supplements that contain vitamin A, vitamin E, or both. °? Hormone replacement therapy that contains estrogen with or without progestin. °Lifestyle °· Follow an exercise program approved by your health care provider. Aim for 150 minutes of moderate exercise or 75 minutes of vigorous exercise each week. °· Maintain a healthy weight or lose weight as approved by your health care provider. °· Learn to manage stress or try to limit your stress. Ask your health care provider for suggestions if you need help. °· Get screened for depression and seek treatment, if needed. °· Do not use any products that contain nicotine or tobacco, such as cigarettes, e-cigarettes, and chewing tobacco. If you need help quitting, ask your health care provider. °· Do not use illegal drugs. °Eating and drinking ° °· Follow a heart-healthy diet. A dietitian can help educate you about healthy food options and changes. In general, eat  plenty of fruits and vegetables, lean meats, and whole grains. °· Avoid foods high in: °? Sugar. °? Salt (sodium). °? Saturated fats, such as processed or fatty meat. °? Trans fats, such as fried food. °· Use healthy cooking methods such as roasting, grilling, broiling, baking, poaching, steaming, or stir-frying. °· Do not drink alcohol if: °? Your health care provider tells you not to drink. °? You are pregnant, may be pregnant, or are planning to become pregnant. °· If you drink alcohol: °? Limit how much you have to 0-1 drink a day. °? Be aware of how much alcohol is in your drink. In the [place] one drink equals one 12 oz bottle of beer (355 mL), one 5 oz glass of wine (148 mL), or one 1½ oz glass of hard liquor (44 mL). °General instructions °· Manage any other health conditions, such as hypertension and diabetes. These conditions affect your heart. °· Your health care provider may ask you to monitor your blood pressure. Ideally, your blood pressure should be below 130/80. °· Keep all follow-up visits as told by your health care provider. This is important. °Get help right away if: °· You have pain in your chest, neck, ear, arm, jaw, stomach, or back that: °? Lasts more than a few minutes. °?   Is recurring. °? Is not relieved by taking medicine under your tongue (sublingual nitroglycerin). °· You have profuse sweating without cause. °· You have unexplained: °? Heartburn or indigestion. °? Shortness of breath or difficulty breathing. °? Fluttering or fast heartbeat (palpitations). °? Nausea or vomiting. °? Fatigue. °? Feelings of nervousness or anxiety. °? Weakness. °? Diarrhea. °· You have sudden light-headedness or dizziness. °· You faint. °· You feel like hurting yourself or think about taking your own life. °These symptoms may represent a serious problem that is an emergency. Do not wait to see if the symptoms will go away. Get medical help right away. Call your local emergency services (911 in the [place] Do  not drive yourself to the hospital. °Summary °· Coronary artery disease (CAD) is a condition in which the arteries that lead to the heart (coronary arteries) become narrow or blocked. The narrowing or blockage can lead to a heart attack. °· Many women have chest discomfort and other common symptoms of CAD. However, women often have unusual (atypical) symptoms, such as fatigue, vomiting, weakness, or dizziness. °· CAD can be treated with lifestyle changes, medicines, surgery, or a combination of these treatments. °This information is not intended to replace advice given to you by your health care provider. Make sure you discuss any questions you have with your health care provider. °Document Released: 11/18/2011 Document Revised: 05/15/2018 Document Reviewed: 05/05/2018 °Elsevier Patient Education © 2020 [employer] ° °

## 2019-07-23 NOTE — Progress Notes (Signed)
CARDIAC REHAB PHASE I   PRE:  Rate/Rhythm: 72 SR with PACs    BP: sitting 119/64    SaO2:   MODE:  Ambulation: 400 ft   POST:  Rate/Rhythm: 100 ST    BP: sitting 135/61     SaO2:   Tolerated well, feels good. Sts she can tell a difference in how she feels after stents. Understands the importance of Plavix/ASA. Discussed diet and exercise. Reviewed NTG. Will refer to [place], ACSM 07/23/2019 9:14 AM

## 2019-07-28 DIAGNOSIS — Z6834 Body mass index (BMI) 34.0-34.9, adult: Secondary | ICD-10-CM | POA: Diagnosis not present

## 2019-07-28 DIAGNOSIS — I10 Essential (primary) hypertension: Secondary | ICD-10-CM | POA: Diagnosis not present

## 2019-07-28 DIAGNOSIS — Z1331 Encounter for screening for depression: Secondary | ICD-10-CM | POA: Diagnosis not present

## 2019-07-28 DIAGNOSIS — E7800 Pure hypercholesterolemia, unspecified: Secondary | ICD-10-CM | POA: Diagnosis not present

## 2019-07-29 ENCOUNTER — Telehealth: Payer: Self-pay | Admitting: Cardiology

## 2019-07-29 NOTE — Telephone Encounter (Signed)
Patient called and she has a knot in her groin and she felt it this am.. please cal her she is alarmed??

## 2019-07-30 NOTE — Telephone Encounter (Signed)
Patient states areas feels smaller today. There is no warmth or edema, no drainage. RN advised patient to keep an eye on it for change in symptoms and seek immediate medical attention, if something changes. Note routed to RRR for inclusion.

## 2019-08-12 NOTE — Addendum Note (Signed)
Addended by: Beckey Rutter on: 08/12/2019 02:19 PM   Modules accepted: Orders

## 2019-08-13 ENCOUNTER — Other Ambulatory Visit: Payer: Self-pay

## 2019-08-13 ENCOUNTER — Encounter: Payer: Self-pay | Admitting: Cardiology

## 2019-08-13 ENCOUNTER — Ambulatory Visit (INDEPENDENT_AMBULATORY_CARE_PROVIDER_SITE_OTHER): Payer: Medicare Other | Admitting: Cardiology

## 2019-08-13 VITALS — BP 110/70 | HR 78 | Ht 63.0 in | Wt 195.0 lb

## 2019-08-13 DIAGNOSIS — N189 Chronic kidney disease, unspecified: Secondary | ICD-10-CM

## 2019-08-13 DIAGNOSIS — I2510 Atherosclerotic heart disease of native coronary artery without angina pectoris: Secondary | ICD-10-CM | POA: Diagnosis not present

## 2019-08-13 DIAGNOSIS — I10 Essential (primary) hypertension: Secondary | ICD-10-CM | POA: Diagnosis not present

## 2019-08-13 DIAGNOSIS — E088 Diabetes mellitus due to underlying condition with unspecified complications: Secondary | ICD-10-CM

## 2019-08-13 DIAGNOSIS — Z951 Presence of aortocoronary bypass graft: Secondary | ICD-10-CM

## 2019-08-13 DIAGNOSIS — E7800 Pure hypercholesterolemia, unspecified: Secondary | ICD-10-CM | POA: Diagnosis not present

## 2019-08-13 DIAGNOSIS — Z1329 Encounter for screening for other suspected endocrine disorder: Secondary | ICD-10-CM

## 2019-08-13 NOTE — Patient Instructions (Signed)
Medication Instructions:  Your physician recommends that you continue on your current medications as directed. Please refer to the Current Medication list given to you today.  *If you need a refill on your cardiac medications before your next appointment, please call your pharmacy*  Lab Work: Your physician recommends that you have a BMP drawn today   You need to return prior to your next visit any week day between 8-4 except 12-1 to have a repeat BMP, TSH, Hepatic and lipid drawn   If you have labs (blood work) drawn today and your tests are completely normal, you will receive your results only by: Marland Kitchen MyChart Message (if you have MyChart) OR . A paper copy in the mail If you have any lab test that is abnormal or we need to change your treatment, we will call you to review the results.  Testing/Procedures: NONE  Follow-Up: At [facility], you and your health needs are our priority.  As part of our continuing mission to provide you with exceptional heart care, we have created designated Provider Care Teams.  These Care Teams include your primary Cardiologist (physician) and Advanced Practice Providers (APPs -  Physician Assistants and Nurse Practitioners) who all work together to provide you with the care you need, when you need it.  Your next appointment:   3 month(s)  The format for your next appointment:   In Person  Provider:   Jyl Heinz, MD

## 2019-08-13 NOTE — Progress Notes (Signed)
Cardiology Office Note:    Date:  08/13/2019   ID:  Alyssa Cooper, DOB Nov 02, 1940, MRN [id number]  PCP:  Alyssa Sheriff, MD  Cardiologist:  Alyssa Lindau, MD   Referring MD: Alyssa Sheriff, MD    ASSESSMENT:    1. Coronary artery disease involving native coronary artery of native heart without angina pectoris   2. Essential hypertension   3. Pure hypercholesterolemia   4. Chronic kidney disease, unspecified CKD stage   5. Diabetes mellitus due to underlying condition with unspecified complications ([place])   6. Hx of CABG    PLAN:    In order of problems listed above:  1. Coronary artery disease: Secondary prevention stressed with the patient.  Importance of compliance with diet and medication stressed and she vocalized understanding.  Her blood pressure is stable.  Details of coronary intervention discussed with her at extensive length and I reviewed records extensively. 2. Essential hypertension: Blood pressure stable 3. Diabetes mellitus and mixed dyslipidemia: Diet was discussed with Alyssa Cooper at extensive length.  Importance of regular exercise stressed and she promises to do so regularly.  She is taking rosuvastatin 5 mg 2 or 3 times a week.  She does not want to increase this because of side effects of joint pains in the past.  She is not interested in any other lipid-lowering therapy like Zetia or injectable therapy.  I respect her wishes. 4. Patient will be seen in follow-up appointment in 6 months or earlier if the patient has any concerns 5. She will have fasting blood work before her visit and will have a Chem-7 today.   Medication Adjustments/Labs and Tests Ordered: Current medicines are reviewed at length with the patient today.  Concerns regarding medicines are outlined above.  No orders of the defined types were placed in this encounter.  No orders of the defined types were placed in this encounter.    Chief Complaint  Patient presents with  . Follow-up      History of Present Illness:    Alyssa Cooper is a 78 y.o. female.  Patient was evaluated by me for preoperative purposes.  She has history of coronary artery disease and bypass surgery in the past.  She has history of essential hypertension and renal insufficiency.  Her stress testing was abnormal and she underwent coronary stenting.  Subsequently she is done fine.  No chest pain orthopnea or PND.  She is not interested in the cardiac rehab program.  Past Medical History:  Diagnosis Date  . Anemia, pernicious   . Chronic ischemic heart disease   . Chronic kidney disease   . Chronic kidney disease, stage 3 (moderate) 12/01/2018  . Helicobacter heilmannii gastritis   . History of melanoma   . Hypertension 12/01/2018  . Obesity   . Osteoarthritis   . Pure hypercholesterolemia   . Renal osteodystrophy 12/01/2018  . Type 2 diabetes mellitus with diabetic chronic kidney disease ([place]) 12/01/2018  . Vitamin D deficiency     Past Surgical History:  Procedure Laterality Date  . BACK SURGERY  2011  . BUNIONECTOMY    . BYPASS GRAFT  2000  . CORONARY STENT INTERVENTION N/A 07/22/2019   Procedure: CORONARY STENT INTERVENTION;  Surgeon: Jettie Booze, MD;  Location: [facility];  Service: Cardiovascular;  Laterality: N/A;  . LEFT HEART CATH AND CORS/GRAFTS ANGIOGRAPHY N/A 07/22/2019   Procedure: LEFT HEART CATH AND CORS/GRAFTS ANGIOGRAPHY;  Surgeon: Jettie Booze, MD;  Location:  [facility];  Service: Cardiovascular;  Laterality: N/A;  . PARTIAL HYSTERECTOMY    . REPLACEMENT TOTAL KNEE      Current Medications: Current Meds  Medication Sig  . amLODipine (NORVASC) 10 MG tablet Take 1 tablet (10 mg total) by mouth daily.  Marland Kitchen aspirin EC 81 MG tablet Take 81 mg by mouth daily.  . carvedilol (COREG) 25 MG tablet Take 1 tablet by mouth 2 (two) times daily with a meal.   . cloNIDine (CATAPRES) 0.1 MG tablet Take 0.1 mg by mouth at bedtime.  . clopidogrel (PLAVIX) 75 MG  tablet Take 1 tablet (75 mg total) by mouth daily with breakfast.  . ergocalciferol (VITAMIN D2) 1.25 MG (50000 UT) capsule Take 50,000 Units by mouth 2 (two) times a week.  . furosemide (LASIX) 20 MG tablet Take 20 mg by mouth daily as needed.  . insulin glargine (LANTUS) 100 UNIT/ML injection Inject 30 Units into the skin at bedtime.   . liraglutide (VICTOZA) 18 MG/3ML SOPN Inject 1.8 mg into the skin at bedtime.   . metFORMIN (GLUCOPHAGE) 500 MG tablet Take 500 mg by mouth 2 (two) times daily.  . nitroGLYCERIN (NITROSTAT) 0.4 MG SL tablet Place 1 tablet (0.4 mg total) under the tongue every 5 (five) minutes as needed.  . quinapril (ACCUPRIL) 40 MG tablet Take 40 mg by mouth daily.   . rosuvastatin (CRESTOR) 5 MG tablet Take 5 mg by mouth 2 (two) times a week. At bedtime ONLY     Allergies:   Penicillins, Atorvastatin, Livalo [pitavastatin], Motrin [ibuprofen], Nsaids, Tramadol, Zetia [ezetimibe], Bactrim [sulfamethoxazole-trimethoprim], and Furosemide   Social History   Socioeconomic History  . Marital status: Married    Spouse name: Not on file  . Number of children: Not on file  . Years of education: Not on file  . Highest education level: Not on file  Occupational History  . Not on file  Social Needs  . Financial resource strain: Not on file  . Food insecurity    Worry: Not on file    Inability: Not on file  . Transportation needs    Medical: Not on file    Non-medical: Not on file  Tobacco Use  . Smoking status: Never Smoker  . Smokeless tobacco: Never Used  Substance and Sexual Activity  . Alcohol use: Never    Frequency: Never  . Drug use: Never  . Sexual activity: Not on file  Lifestyle  . Physical activity    Days per week: Not on file    Minutes per session: Not on file  . Stress: Not on file  Relationships  . Social Herbalist on phone: Not on file    Gets together: Not on file    Attends religious service: Not on file    Active member of club  or organization: Not on file    Attends meetings of clubs or organizations: Not on file    Relationship status: Not on file  Other Topics Concern  . Not on file  Social History Narrative  . Not on file     Family History: The patient's family history includes Diabetes in her father and sister; Lung cancer in her brother.  ROS:   Please see the history of present illness.    All other systems reviewed and are negative.  EKGs/Labs/Other Studies Reviewed:    The following studies were reviewed today: Jettie Booze, MD (Primary)    Procedures  CORONARY STENT  INTERVENTION  LEFT HEART CATH AND CORS/GRAFTS ANGIOGRAPHY  Conclusion    Mid RCA lesion is 100% stenosed. SVG to RCA is occluded.  Mid Cx lesion is 75% stenosed. SVG to OM is occluded.  A drug-eluting stent was successfully placed using a STENT RESOLUTE ONYX 2.0X22.  Post intervention, there is a 0% residual stenosis.  Ramus lesion is 75% stenosed.  A drug-eluting stent was successfully placed using a STENT RESOLUTE ONYX 2.0X26.  Post intervention, there is a 0% residual stenosis.  Mid LAD lesion is 100% stenosed. LIMA to LAD.  2nd Diag lesion is 80% stenosed. This is a small diffusely diseased vessel.  LV end diastolic pressure is normal.  There is no aortic valve stenosis.   Continue aggressive secondary prevention.  Watch overnight.  Hydration for renal disease.    Shoulder surgery will have to be delayed, ideally for a few months.       Recent Labs: 07/23/2019: BUN 14; Creatinine, Ser 1.29; Hemoglobin 11.6; Platelets 163; Potassium 3.9; Sodium 141  Recent Lipid Panel    Component Value Date/Time   CHOL 206 (H) 07/23/2019 0934   TRIG 135 07/23/2019 0934   HDL 46 07/23/2019 0934   CHOLHDL 4.5 07/23/2019 0934   VLDL 27 07/23/2019 0934   LDLCALC 133 (H) 07/23/2019 0934    Physical Exam:    VS:  BP 110/70 (BP Location: Left Arm, Patient Position: Sitting, Cuff Size: Normal)   Pulse 78    Ht 5\' 3"  (1.6 m)   Wt 195 lb (88.5 kg)   SpO2 97%   BMI 34.54 kg/m     Wt Readings from Last 3 Encounters:  08/13/19 195 lb (88.5 kg)  07/23/19 190 lb 1.6 oz (86.2 kg)  07/14/19 192 lb (87.1 kg)     GEN: Patient is in no acute distress HEENT: Normal NECK: No JVD; No carotid bruits LYMPHATICS: No lymphadenopathy CARDIAC: Hear sounds regular, 2/6 systolic murmur at the apex. RESPIRATORY:  Clear to auscultation without rales, wheezing or rhonchi  ABDOMEN: Soft, non-tender, non-distended MUSCULOSKELETAL:  No edema; No deformity  SKIN: Warm and dry NEUROLOGIC:  Alert and oriented x 3 PSYCHIATRIC:  Normal affect   Signed, Alyssa Lindau, MD  08/13/2019 10:55 AM    [place]

## 2019-08-14 LAB — BASIC METABOLIC PANEL
BUN/Creatinine Ratio: 15 (ref 12–28)
BUN: 24 mg/dL (ref 8–27)
CO2: 20 mmol/L (ref 20–29)
Calcium: 10.4 mg/dL — ABNORMAL HIGH (ref 8.7–10.3)
Chloride: 105 mmol/L (ref 96–106)
Creatinine, Ser: 1.59 mg/dL — ABNORMAL HIGH (ref 0.57–1.00)
GFR calc Af Amer: 35 mL/min/{1.73_m2} — ABNORMAL LOW (ref >59)
GFR calc non Af Amer: 30 mL/min/{1.73_m2} — ABNORMAL LOW (ref >59)
Glucose: 95 mg/dL (ref 65–99)
Potassium: 4 mmol/L (ref 3.5–5.2)
Sodium: 143 mmol/L (ref 134–144)

## 2019-08-18 ENCOUNTER — Telehealth: Payer: Self-pay

## 2019-08-18 NOTE — Telephone Encounter (Signed)
Results relayed, copy sent to Dr. Lin Landsman

## 2019-08-18 NOTE — Telephone Encounter (Signed)
-----   Message from Jenean Lindau, MD sent at 08/16/2019 11:55 AM EST ----- recheck Chem-7 in 1 month. tHe results of the study is unremarkable. Please inform patient. I will discuss in detail at next appointment. Cc  primary care/referring physician Jenean Lindau, MD 08/16/2019 11:54 AM

## 2019-08-24 DIAGNOSIS — I10 Essential (primary) hypertension: Secondary | ICD-10-CM | POA: Diagnosis not present

## 2019-08-24 DIAGNOSIS — N189 Chronic kidney disease, unspecified: Secondary | ICD-10-CM | POA: Diagnosis not present

## 2019-08-24 DIAGNOSIS — E7800 Pure hypercholesterolemia, unspecified: Secondary | ICD-10-CM | POA: Diagnosis not present

## 2019-08-24 DIAGNOSIS — I2510 Atherosclerotic heart disease of native coronary artery without angina pectoris: Secondary | ICD-10-CM | POA: Diagnosis not present

## 2019-08-24 LAB — HEPATIC FUNCTION PANEL
ALT: 9 IU/L (ref 0–32)
AST: 10 IU/L (ref 0–40)
Albumin: 4.8 g/dL — ABNORMAL HIGH (ref 3.7–4.7)
Alkaline Phosphatase: 74 IU/L (ref 39–117)
Bilirubin Total: 0.4 mg/dL (ref 0.0–1.2)
Bilirubin, Direct: 0.14 mg/dL (ref 0.00–0.40)
Total Protein: 6.7 g/dL (ref 6.0–8.5)

## 2019-08-24 LAB — BASIC METABOLIC PANEL
BUN/Creatinine Ratio: 14 (ref 12–28)
BUN: 18 mg/dL (ref 8–27)
CO2: 23 mmol/L (ref 20–29)
Calcium: 9.8 mg/dL (ref 8.7–10.3)
Chloride: 105 mmol/L (ref 96–106)
Creatinine, Ser: 1.3 mg/dL — ABNORMAL HIGH (ref 0.57–1.00)
GFR calc Af Amer: 45 mL/min/{1.73_m2} — ABNORMAL LOW (ref >59)
GFR calc non Af Amer: 39 mL/min/{1.73_m2} — ABNORMAL LOW (ref >59)
Glucose: 73 mg/dL (ref 65–99)
Potassium: 4.4 mmol/L (ref 3.5–5.2)
Sodium: 144 mmol/L (ref 134–144)

## 2019-08-24 LAB — TSH: TSH: 1.9 u[IU]/mL (ref 0.450–4.500)

## 2019-08-24 LAB — LIPID PANEL
Chol/HDL Ratio: 2.9 ratio (ref 0.0–4.4)
Cholesterol, Total: 137 mg/dL (ref 100–199)
HDL: 47 mg/dL (ref >39)
LDL Chol Calc (NIH): 75 mg/dL (ref 0–99)
Triglycerides: 77 mg/dL (ref 0–149)
VLDL Cholesterol Cal: 15 mg/dL (ref 5–40)

## 2019-08-30 ENCOUNTER — Telehealth: Payer: Self-pay | Admitting: Cardiology

## 2019-08-30 NOTE — Telephone Encounter (Signed)
Telephone call to patient. Informed of lab results.Copy sent to PCP

## 2019-08-30 NOTE — Telephone Encounter (Signed)
States someone called her, returning their call

## 2019-09-20 DIAGNOSIS — E1165 Type 2 diabetes mellitus with hyperglycemia: Secondary | ICD-10-CM | POA: Diagnosis not present

## 2019-09-20 DIAGNOSIS — I10 Essential (primary) hypertension: Secondary | ICD-10-CM | POA: Diagnosis not present

## 2019-09-20 DIAGNOSIS — Z1331 Encounter for screening for depression: Secondary | ICD-10-CM | POA: Diagnosis not present

## 2019-09-20 DIAGNOSIS — Z6832 Body mass index (BMI) 32.0-32.9, adult: Secondary | ICD-10-CM | POA: Diagnosis not present

## 2019-09-30 DIAGNOSIS — D472 Monoclonal gammopathy: Secondary | ICD-10-CM

## 2019-11-06 ENCOUNTER — Other Ambulatory Visit: Payer: Self-pay | Admitting: Interventional Cardiology

## 2019-11-11 ENCOUNTER — Ambulatory Visit (INDEPENDENT_AMBULATORY_CARE_PROVIDER_SITE_OTHER): Payer: Medicare Other | Admitting: Cardiology

## 2019-11-11 ENCOUNTER — Encounter: Payer: Self-pay | Admitting: Cardiology

## 2019-11-11 ENCOUNTER — Other Ambulatory Visit: Payer: Self-pay

## 2019-11-11 VITALS — BP 134/84 | HR 86 | Ht 63.0 in | Wt 189.0 lb

## 2019-11-11 DIAGNOSIS — Z9861 Coronary angioplasty status: Secondary | ICD-10-CM

## 2019-11-11 DIAGNOSIS — I2510 Atherosclerotic heart disease of native coronary artery without angina pectoris: Secondary | ICD-10-CM

## 2019-11-11 DIAGNOSIS — Z951 Presence of aortocoronary bypass graft: Secondary | ICD-10-CM

## 2019-11-11 DIAGNOSIS — E7800 Pure hypercholesterolemia, unspecified: Secondary | ICD-10-CM

## 2019-11-11 DIAGNOSIS — I10 Essential (primary) hypertension: Secondary | ICD-10-CM | POA: Diagnosis not present

## 2019-11-11 NOTE — Patient Instructions (Signed)

## 2019-11-11 NOTE — Progress Notes (Signed)
Cardiology Office Note:    Date:  11/11/2019   ID:  [name], DOB Oct 02, 1940, MRN [id number]  PCP:  Angelina Sheriff, MD  Cardiologist:  Jenean Lindau, MD   Referring MD: Angelina Sheriff, MD    ASSESSMENT:    1. Coronary artery disease involving native coronary artery of native heart without angina pectoris   2. CAD S/P percutaneous coronary angioplasty   3. Essential hypertension   4. Pure hypercholesterolemia   5. Hx of CABG    PLAN:    In order of problems listed above:  1. Coronary artery disease: I discussed my findings with the patient extensively and secondary prevention stressed.  Importance of compliance with diet and medication stressed and she vocalized understanding. 2. Essential hypertension: Blood pressure is stable 3. Mixed dyslipidemia: She was told by her primary care physician to update her statin and she is taking it 3 days a week now.  She used to take it twice a week.  Anything about that does give her a problem in terms of myalgias.  Lipids will be followed by primary care physician and diet was emphasized. 4. Patient will be seen in follow-up appointment in 6 months or earlier if the patient has any concerns    Medication Adjustments/Labs and Tests Ordered: Current medicines are reviewed at length with the patient today.  Concerns regarding medicines are outlined above.  No orders of the defined types were placed in this encounter.  No orders of the defined types were placed in this encounter.    Chief Complaint  Patient presents with  . Follow-up    3 Months     History of Present Illness:    Alyssa Cooper is a 79 y.o. female.  Patient has past medical history of coronary artery disease post intervention.  She has had CABG in the past, essential hypertension dyslipidemia and diabetes mellitus.  She denies any problems at this time and takes care of activities of daily living.  She is not exercising on a regular basis.  Overall  she is very happy with the way she is coming along.  At the time of my evaluation, the patient is alert awake oriented and in no distress.  Past Medical History:  Diagnosis Date  . Abnormal nuclear cardiac imaging test 06/11/2019  . Abnormal stress test 07/22/2019  . Anemia, pernicious   . CAD (coronary artery disease) 05/26/2019  . CAD S/P percutaneous coronary angioplasty 07/23/2019  . Chronic ischemic heart disease   . Chronic kidney disease   . Chronic kidney disease, stage 3 (moderate) 12/01/2018  . Diabetes mellitus due to underlying condition with unspecified complications ([place]) [id number]  . Helicobacter heilmannii gastritis   . History of melanoma   . Hx of CABG 05/26/2019  . Hypertension 12/01/2018  . Obesity   . Osteoarthritis   . Preoperative cardiovascular examination 06/11/2019  . Pure hypercholesterolemia   . Renal osteodystrophy 12/01/2018  . Type 2 diabetes mellitus with diabetic chronic kidney disease ([place]) 12/01/2018  . Vitamin D deficiency     Past Surgical History:  Procedure Laterality Date  . BACK SURGERY  2011  . BUNIONECTOMY    . BYPASS GRAFT  2000  . CORONARY STENT INTERVENTION N/A 07/22/2019   Procedure: CORONARY STENT INTERVENTION;  Surgeon: Jettie Booze, MD;  Location: [facility];  Service: Cardiovascular;  Laterality: N/A;  . LEFT HEART CATH AND CORS/GRAFTS ANGIOGRAPHY N/A 07/22/2019   Procedure: LEFT HEART CATH  AND CORS/GRAFTS ANGIOGRAPHY;  Surgeon: Jettie Booze, MD;  Location: [facility];  Service: Cardiovascular;  Laterality: N/A;  . PARTIAL HYSTERECTOMY    . REPLACEMENT TOTAL KNEE      Current Medications: Current Meds  Medication Sig  . amLODipine (NORVASC) 10 MG tablet Take 1 tablet by mouth once daily (Patient taking differently: Take 5 mg by mouth daily. )  . aspirin EC 81 MG tablet Take 81 mg by mouth daily.  . carvedilol (COREG) 25 MG tablet Take 1 tablet by mouth 2 (two) times daily with a meal.   . cloNIDine  (CATAPRES) 0.1 MG tablet Take 0.1 mg by mouth at bedtime.  . ergocalciferol (VITAMIN D2) 1.25 MG (50000 UT) capsule Take 50,000 Units by mouth 2 (two) times a week.  . ezetimibe (ZETIA) 10 MG tablet Take 10 mg by mouth daily.  . furosemide (LASIX) 20 MG tablet Take 20 mg by mouth daily as needed.  . insulin glargine (LANTUS) 100 UNIT/ML injection Inject 32 Units into the skin at bedtime.   . liraglutide (VICTOZA) 18 MG/3ML SOPN Inject 1.8 mg into the skin at bedtime.   . nitroGLYCERIN (NITROSTAT) 0.4 MG SL tablet Place 1 tablet (0.4 mg total) under the tongue every 5 (five) minutes as needed.  . quinapril (ACCUPRIL) 40 MG tablet Take 40 mg by mouth daily.   . rosuvastatin (CRESTOR) 5 MG tablet Take 5 mg by mouth 2 (two) times a week. At bedtime ONLY     Allergies:   Penicillins, Atorvastatin, Livalo [pitavastatin], Motrin [ibuprofen], Nsaids, Tramadol, Zetia [ezetimibe], Bactrim [sulfamethoxazole-trimethoprim], and Furosemide   Social History   Socioeconomic History  . Marital status: Married    Spouse name: Not on file  . Number of children: Not on file  . Years of education: Not on file  . Highest education level: Not on file  Occupational History  . Not on file  Tobacco Use  . Smoking status: Never Smoker  . Smokeless tobacco: Never Used  Substance and Sexual Activity  . Alcohol use: Never  . Drug use: Never  . Sexual activity: Not on file  Other Topics Concern  . Not on file  Social History Narrative  . Not on file   Social Determinants of Health   Financial Resource Strain:   . Difficulty of Paying Living Expenses: Not on file  Food Insecurity:   . Worried About Charity fundraiser in the Last Year: Not on file  . Ran Out of Food in the Last Year: Not on file  Transportation Needs:   . Lack of Transportation (Medical): Not on file  . Lack of Transportation (Non-Medical): Not on file  Physical Activity:   . Days of Exercise per Week: Not on file  . Minutes of  Exercise per Session: Not on file  Stress:   . Feeling of Stress : Not on file  Social Connections:   . Frequency of Communication with Friends and Family: Not on file  . Frequency of Social Gatherings with Friends and Family: Not on file  . Attends Religious Services: Not on file  . Active Member of Clubs or Organizations: Not on file  . Attends Archivist Meetings: Not on file  . Marital Status: Not on file     Family History: The patient's family history includes Diabetes in her father and sister; Lung cancer in her brother.  ROS:   Please see the history of present illness.    All other systems  reviewed and are negative.  EKGs/Labs/Other Studies Reviewed:    The following studies were reviewed today: CARDIAC CATHETERIZATION Order# [id number] Reading physician: Jettie Booze, MD Ordering physician: Jettie Booze, MD Study date: 07/22/19  MyChart Results Release  MyChart Status: Active Results Release  Physicians  Panel Physicians Referring Physician Case Authorizing Physician  Jettie Booze, MD (Primary)    Procedures  CORONARY STENT INTERVENTION  LEFT HEART CATH AND CORS/GRAFTS ANGIOGRAPHY  Conclusion    Mid RCA lesion is 100% stenosed. SVG to RCA is occluded.  Mid Cx lesion is 75% stenosed. SVG to OM is occluded.  A drug-eluting stent was successfully placed using a STENT RESOLUTE ONYX 2.0X22.  Post intervention, there is a 0% residual stenosis.  Ramus lesion is 75% stenosed.  A drug-eluting stent was successfully placed using a STENT RESOLUTE ONYX 2.0X26.  Post intervention, there is a 0% residual stenosis.  Mid LAD lesion is 100% stenosed. LIMA to LAD.  2nd Diag lesion is 80% stenosed. This is a small diffusely diseased vessel.  LV end diastolic pressure is normal.  There is no aortic valve stenosis.   Continue aggressive secondary prevention.  Watch overnight.  Hydration for renal disease.    Shoulder surgery  will have to be delayed, ideally for a few months.       Recent Labs: 07/23/2019: Hemoglobin 11.6; Platelets 163 08/24/2019: ALT 9; BUN 18; Creatinine, Ser 1.30; Potassium 4.4; Sodium 144; TSH 1.900  Recent Lipid Panel    Component Value Date/Time   CHOL 137 08/24/2019 0944   TRIG 77 08/24/2019 0944   HDL 47 08/24/2019 0944   CHOLHDL 2.9 08/24/2019 0944   CHOLHDL 4.5 07/23/2019 0934   VLDL 27 07/23/2019 0934   LDLCALC 75 08/24/2019 0944    Physical Exam:    VS:  BP 134/84   Pulse 86   Ht 5\' 3"  (1.6 m)   Wt 189 lb (85.7 kg)   SpO2 97%   BMI 33.48 kg/m     Wt Readings from Last 3 Encounters:  11/11/19 189 lb (85.7 kg)  08/13/19 195 lb (88.5 kg)  07/23/19 190 lb 1.6 oz (86.2 kg)     GEN: Patient is in no acute distress HEENT: Normal NECK: No JVD; No carotid bruits LYMPHATICS: No lymphadenopathy CARDIAC: Hear sounds regular, 2/6 systolic murmur at the apex. RESPIRATORY:  Clear to auscultation without rales, wheezing or rhonchi  ABDOMEN: Soft, non-tender, non-distended MUSCULOSKELETAL:  No edema; No deformity  SKIN: Warm and dry NEUROLOGIC:  Alert and oriented x 3 PSYCHIATRIC:  Normal affect   Signed, Jenean Lindau, MD  11/11/2019 11:29 AM    [facility]

## 2019-11-12 DIAGNOSIS — E109 Type 1 diabetes mellitus without complications: Secondary | ICD-10-CM | POA: Diagnosis not present

## 2019-11-16 ENCOUNTER — Telehealth: Payer: Self-pay

## 2019-11-16 NOTE — Telephone Encounter (Signed)
Pt brought list of medications from home. Pt request Revankar review & contact pt if she needs to stop taking any or make any changes since her procedure.

## 2019-12-14 DIAGNOSIS — E1165 Type 2 diabetes mellitus with hyperglycemia: Secondary | ICD-10-CM | POA: Diagnosis not present

## 2019-12-20 DIAGNOSIS — Z6832 Body mass index (BMI) 32.0-32.9, adult: Secondary | ICD-10-CM | POA: Diagnosis not present

## 2019-12-20 DIAGNOSIS — E7800 Pure hypercholesterolemia, unspecified: Secondary | ICD-10-CM | POA: Diagnosis not present

## 2019-12-20 DIAGNOSIS — Z1331 Encounter for screening for depression: Secondary | ICD-10-CM | POA: Diagnosis not present

## 2019-12-20 DIAGNOSIS — E1129 Type 2 diabetes mellitus with other diabetic kidney complication: Secondary | ICD-10-CM | POA: Diagnosis not present

## 2019-12-23 DIAGNOSIS — M19072 Primary osteoarthritis, left ankle and foot: Secondary | ICD-10-CM | POA: Insufficient documentation

## 2019-12-23 DIAGNOSIS — M7672 Peroneal tendinitis, left leg: Secondary | ICD-10-CM

## 2019-12-23 DIAGNOSIS — E119 Type 2 diabetes mellitus without complications: Secondary | ICD-10-CM | POA: Insufficient documentation

## 2019-12-23 HISTORY — DX: Peroneal tendinitis, left leg: M76.72

## 2019-12-23 HISTORY — DX: Primary osteoarthritis, left ankle and foot: M19.072

## 2019-12-23 HISTORY — DX: Type 2 diabetes mellitus without complications: E11.9

## 2020-01-13 DIAGNOSIS — B351 Tinea unguium: Secondary | ICD-10-CM

## 2020-01-13 DIAGNOSIS — M7672 Peroneal tendinitis, left leg: Secondary | ICD-10-CM | POA: Diagnosis not present

## 2020-01-13 DIAGNOSIS — E119 Type 2 diabetes mellitus without complications: Secondary | ICD-10-CM | POA: Diagnosis not present

## 2020-01-13 DIAGNOSIS — M79674 Pain in right toe(s): Secondary | ICD-10-CM | POA: Diagnosis not present

## 2020-01-13 HISTORY — DX: Tinea unguium: M79.674

## 2020-01-13 HISTORY — DX: Tinea unguium: B35.1

## 2020-02-19 ENCOUNTER — Other Ambulatory Visit: Payer: Self-pay | Admitting: Interventional Cardiology

## 2020-02-24 ENCOUNTER — Other Ambulatory Visit: Payer: Self-pay | Admitting: Interventional Cardiology

## 2020-03-06 DIAGNOSIS — H26492 Other secondary cataract, left eye: Secondary | ICD-10-CM | POA: Diagnosis not present

## 2020-03-24 DIAGNOSIS — H26492 Other secondary cataract, left eye: Secondary | ICD-10-CM | POA: Diagnosis not present

## 2020-04-04 IMAGING — CT CT SHOULDER*R* W/O CM
2 series · 10 of 14 positions shown, 12 images · non-contrast
Comparison: MRI right shoulder 04/08/2019.

CLINICAL DATA: Increasing right shoulder pain and decreased range
of motion over the past several months. Patient for shoulder
replacement. Preoperative examination.

EXAM:
CT OF THE UPPER RIGHT EXTREMITY WITHOUT CONTRAST
TECHNIQUE: Multidetector CT imaging of the upper right extremity was performed
according to the standard protocol.

[Series 2: bone · axial · 0.47mm/px · z∈[-190,-74]mm · 5 of 88 slices shown, 7 images]
[im 15/88  soft-tissue]
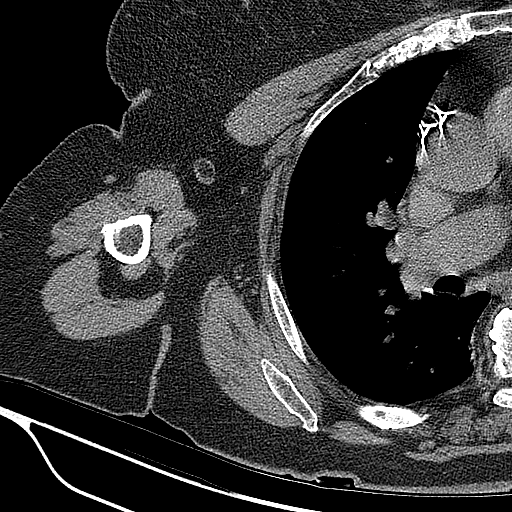
[im 15/88  bone]
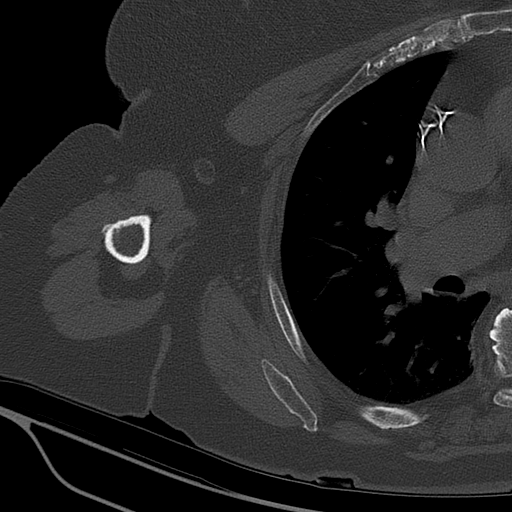
[im 30/88  bone]
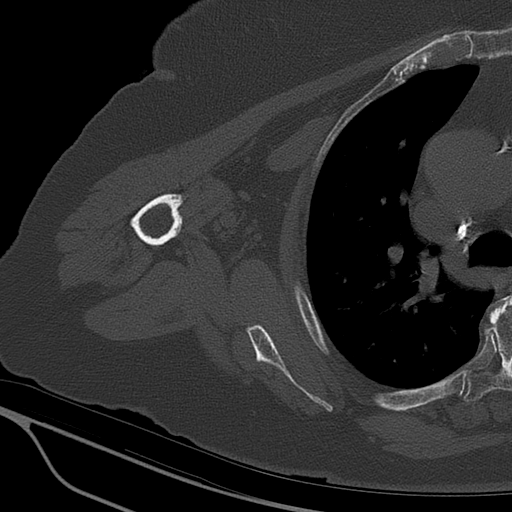
[im 44/88  bone]
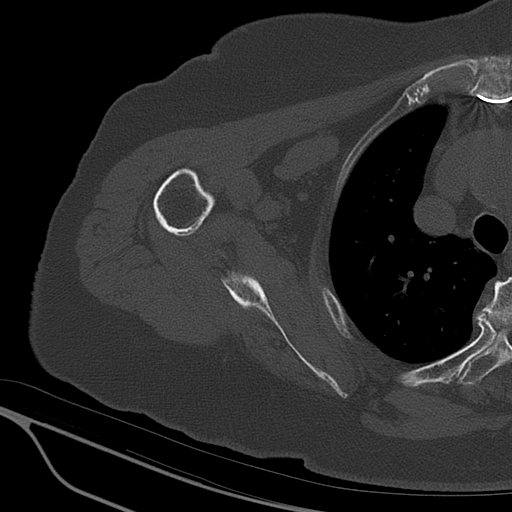
[im 59/88  bone]
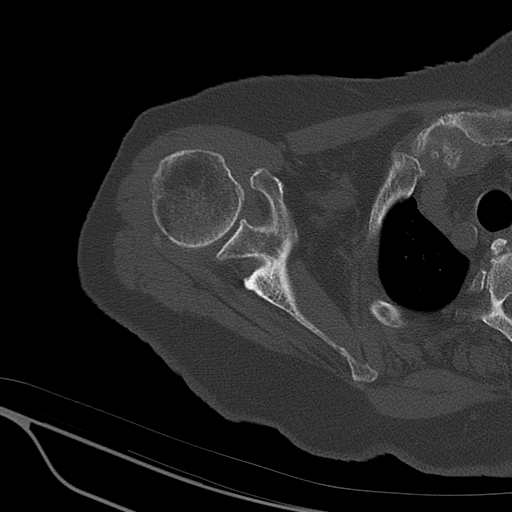
[im 73/88  soft-tissue]
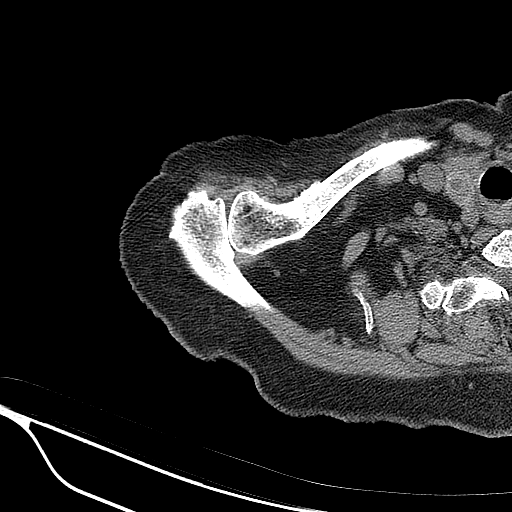
[im 73/88  bone]
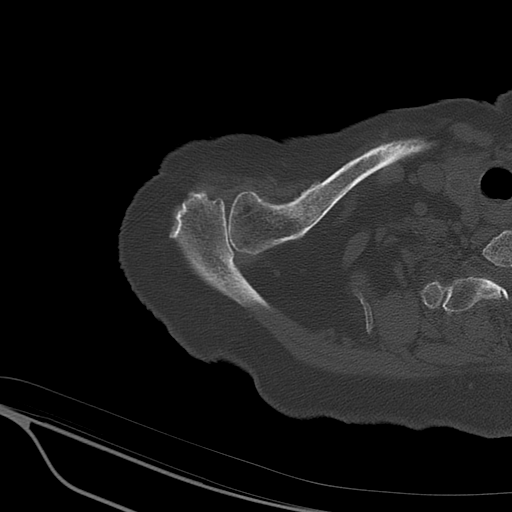

[Series 3: soft tissue · axial · 0.47mm/px · z∈[-186,-74]mm · 5 of 86 slices shown]
[im 15/86  soft-tissue]
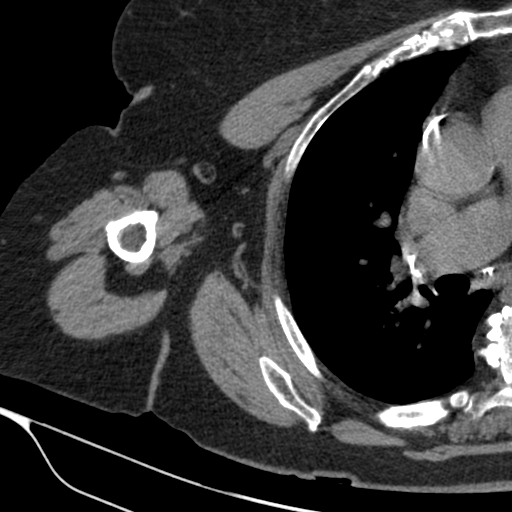
[im 29/86  soft-tissue]
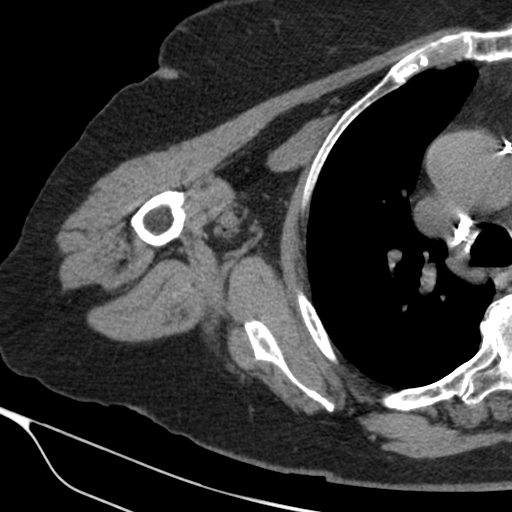
[im 43/86  soft-tissue]
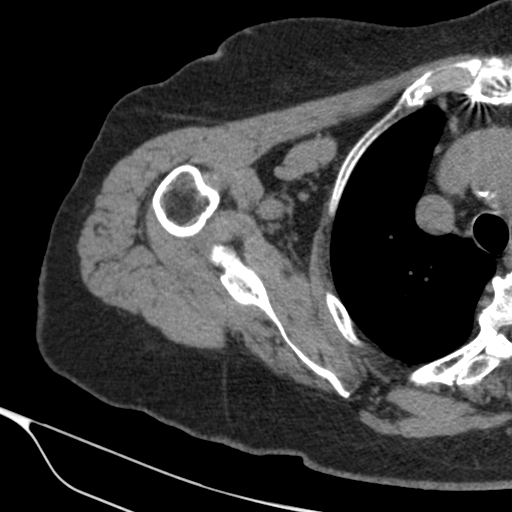
[im 57/86  soft-tissue]
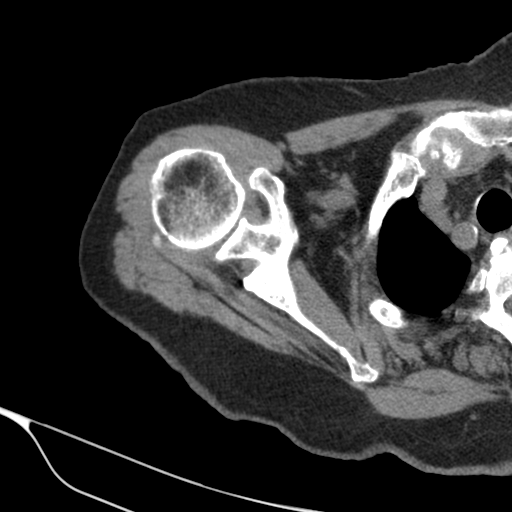
[im 71/86  soft-tissue]
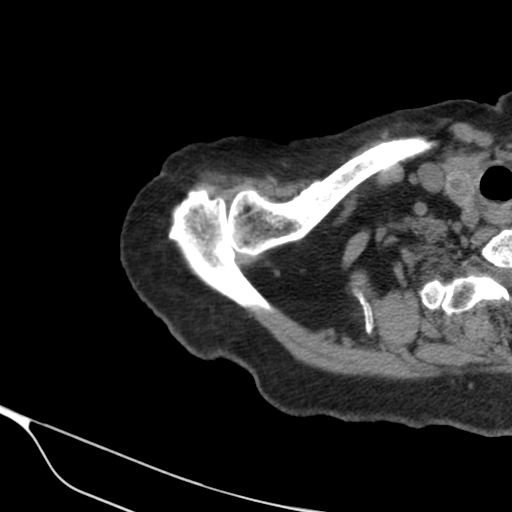

[10 of 14 positions shown; findings below may reference images not displayed]

FINDINGS: Bones/Joint/Cartilage

No acute bony or joint abnormality is identified. There is mild
glenohumeral joint space narrowing and a small osteophyte off the
humeral head. Two small loose bodies measuring approximately 0.5 cm
are seen in the glenohumeral joint. Moderate to moderately severe
acromioclavicular osteoarthritis is identified. Type 2 acromion with
subacromial spurring noted.

Ligaments

Suboptimally assessed by CT.

Muscles and Tendons

Complete supraspinatus and infraspinatus tendon tears with moderate
to moderately severe atrophy of the muscle bellies. The
subscapularis and teres minor appear normal.

Soft tissues

No fluid collection or mass. Imaged lung parenchyma is clear. The
patient is status post CABG.
IMPRESSION: Mild-to-moderate glenohumeral osteoarthritis with 2 loose bodies in
the joint.

Moderately severe acromioclavicular osteoarthritis. Type 2 acromion
with subacromial spurring noted.

Complete supraspinatus and infraspinatus tendon tears as seen on
prior CT scan.

## 2020-05-04 ENCOUNTER — Other Ambulatory Visit: Payer: Self-pay

## 2020-05-04 ENCOUNTER — Encounter: Payer: Self-pay | Admitting: Cardiology

## 2020-05-04 ENCOUNTER — Ambulatory Visit: Payer: Medicare Other | Admitting: Cardiology

## 2020-05-04 VITALS — BP 138/76 | HR 64 | Ht 63.0 in | Wt 179.4 lb

## 2020-05-04 DIAGNOSIS — N1831 Chronic kidney disease, stage 3a: Secondary | ICD-10-CM

## 2020-05-04 DIAGNOSIS — I2510 Atherosclerotic heart disease of native coronary artery without angina pectoris: Secondary | ICD-10-CM | POA: Diagnosis not present

## 2020-05-04 DIAGNOSIS — Z951 Presence of aortocoronary bypass graft: Secondary | ICD-10-CM

## 2020-05-04 DIAGNOSIS — E119 Type 2 diabetes mellitus without complications: Secondary | ICD-10-CM

## 2020-05-04 DIAGNOSIS — E1121 Type 2 diabetes mellitus with diabetic nephropathy: Secondary | ICD-10-CM

## 2020-05-04 DIAGNOSIS — Z79899 Other long term (current) drug therapy: Secondary | ICD-10-CM

## 2020-05-04 DIAGNOSIS — Z5181 Encounter for therapeutic drug level monitoring: Secondary | ICD-10-CM

## 2020-05-04 DIAGNOSIS — I10 Essential (primary) hypertension: Secondary | ICD-10-CM

## 2020-05-04 DIAGNOSIS — E1122 Type 2 diabetes mellitus with diabetic chronic kidney disease: Secondary | ICD-10-CM

## 2020-05-04 NOTE — Progress Notes (Signed)
Cardiology Office Note:    Date:  05/04/2020   ID:  Alyssa Cooper, DOB 1941-06-24, MRN [id number]  PCP:  Angelina Sheriff, MD  Cardiologist:  Jenean Lindau, MD   Referring MD: Angelina Sheriff, MD    ASSESSMENT:    1. Coronary artery disease involving native coronary artery of native heart without angina pectoris   2. Essential hypertension   3. Diabetes mellitus without complication ([place])   4. Hx of CABG   5. Type 2 diabetes mellitus with stage 3a chronic kidney disease, without long-term current use of insulin (HCC)    PLAN:    In order of problems listed above:  1. Coronary artery disease: Secondary prevention stressed with the patient.  Importance of compliance with diet medication stressed and she vocalized understanding.  Coronary angiography report from recent was reviewed with her and questions were answered to her satisfaction.  Importance of regular exercise stressed and she is to walk at least half an hour a day on a regular basis 5 days a week Essential hypertension: Blood pressure is stable and diet was emphasized Mixed dyslipidemia diabetes mellitus: Diet was emphasized.  Importance of regular exercise stressed I told her to come back fasting for blood work including lipids and she promises to come back in the morning. She does have nitroglycerin handy to use as needed sublingually. Patient will be seen in follow-up appointment in 6 months or earlier if the patient has any concerns   Medication Adjustments/Labs and Tests Ordered: Current medicines are reviewed at length with the patient today.  Concerns regarding medicines are outlined above.  No orders of the defined types were placed in this encounter.  No orders of the defined types were placed in this encounter.    No chief complaint on file.    History of Present Illness:    Alyssa Cooper is a 79 y.o. female.  Patient has past medical history of coronary artery disease essential  hypertension dyslipidemia and diabetes mellitus.  She denies any problems at this time and takes care of activities of daily living.  No chest pain orthopnea or PND.  Her son is in hospice for cancer.  She is stressed about the situation.  Again no chest pain and she ambulates some on a regular basis.  She walks the dog without any symptoms.  At the time of my evaluation, the patient is alert awake oriented and in no distress.  Past Medical History:  Diagnosis Date  . Abnormal nuclear cardiac imaging test 06/11/2019  . Abnormal stress test 07/22/2019  . Anemia, pernicious   . CAD (coronary artery disease) 05/26/2019  . CAD S/P percutaneous coronary angioplasty 07/23/2019  . Chronic ischemic heart disease   . Chronic kidney disease   . Chronic kidney disease, stage 3 (moderate) 12/01/2018  . Diabetes mellitus due to underlying condition with unspecified complications ([place]) 02/28/2978  . Helicobacter heilmannii gastritis   . History of melanoma   . Hx of CABG 05/26/2019  . Hypertension 12/01/2018  . Obesity   . Osteoarthritis   . Preoperative cardiovascular examination 06/11/2019  . Pure hypercholesterolemia   . Renal osteodystrophy 12/01/2018  . Type 2 diabetes mellitus with diabetic chronic kidney disease ([place]) 12/01/2018  . Vitamin D deficiency     Past Surgical History:  Procedure Laterality Date  . BACK SURGERY  2011  . BUNIONECTOMY    . BYPASS GRAFT  2000  . CORONARY STENT INTERVENTION N/A 07/22/2019   Procedure:  CORONARY STENT INTERVENTION;  Surgeon: Jettie Booze, MD;  Location: [facility];  Service: Cardiovascular;  Laterality: N/A;  . LEFT HEART CATH AND CORS/GRAFTS ANGIOGRAPHY N/A 07/22/2019   Procedure: LEFT HEART CATH AND CORS/GRAFTS ANGIOGRAPHY;  Surgeon: Jettie Booze, MD;  Location: [facility];  Service: Cardiovascular;  Laterality: N/A;  . PARTIAL HYSTERECTOMY    . REPLACEMENT TOTAL KNEE      Current Medications: Current Meds  Medication  Sig  . amLODipine (NORVASC) 10 MG tablet Take 1 tablet by mouth once daily  . aspirin EC 81 MG tablet Take 81 mg by mouth daily.  . carvedilol (COREG) 25 MG tablet Take 1 tablet by mouth 2 (two) times daily with a meal.   . cloNIDine (CATAPRES) 0.1 MG tablet Take 0.1 mg by mouth at bedtime.  . clopidogrel (PLAVIX) 75 MG tablet Take 75 mg by mouth daily.  . ergocalciferol (VITAMIN D2) 1.25 MG (50000 UT) capsule Take 50,000 Units by mouth 2 (two) times a week.  . furosemide (LASIX) 20 MG tablet Take 20 mg by mouth daily as needed.  Marland Kitchen LANTUS SOLOSTAR 100 UNIT/ML Solostar Pen Inject 30 Units into the skin at bedtime.  . liraglutide (VICTOZA) 18 MG/3ML SOPN Inject 1.8 mg into the skin at bedtime.   . metFORMIN (GLUCOPHAGE) 500 MG tablet Take 500 mg by mouth 2 (two) times daily.  . quinapril (ACCUPRIL) 40 MG tablet Take 40 mg by mouth daily.   . rosuvastatin (CRESTOR) 5 MG tablet Take 5 mg by mouth 2 (two) times a week. At bedtime ONLY     Allergies:   Penicillins, Atorvastatin, Ezetimibe, Motrin [ibuprofen], Nsaids, Pitavastatin, Tramadol, Bactrim [sulfamethoxazole-trimethoprim], and Furosemide   Social History   Socioeconomic History  . Marital status: Married    Spouse name: Not on file  . Number of children: Not on file  . Years of education: Not on file  . Highest education level: Not on file  Occupational History  . Not on file  Tobacco Use  . Smoking status: Never Smoker  . Smokeless tobacco: Never Used  Vaping Use  . Vaping Use: Never used  Substance and Sexual Activity  . Alcohol use: Never  . Drug use: Never  . Sexual activity: Not on file  Other Topics Concern  . Not on file  Social History Narrative  . Not on file   Social Determinants of Health   Financial Resource Strain:   . Difficulty of Paying Living Expenses: Not on file  Food Insecurity:   . Worried About Charity fundraiser in the Last Year: Not on file  . Ran Out of Food in the Last Year: Not on file    Transportation Needs:   . Lack of Transportation (Medical): Not on file  . Lack of Transportation (Non-Medical): Not on file  Physical Activity:   . Days of Exercise per Week: Not on file  . Minutes of Exercise per Session: Not on file  Stress:   . Feeling of Stress : Not on file  Social Connections:   . Frequency of Communication with Friends and Family: Not on file  . Frequency of Social Gatherings with Friends and Family: Not on file  . Attends Religious Services: Not on file  . Active Member of Clubs or Organizations: Not on file  . Attends Archivist Meetings: Not on file  . Marital Status: Not on file     Family History: The patient's family history includes Diabetes  in her father and sister; Lung cancer in her brother.  ROS:   Please see the history of present illness.    All other systems reviewed and are negative.  EKGs/Labs/Other Studies Reviewed:    The following studies were reviewed today: I discussed my findings with the patient at length   Recent Labs: 07/23/2019: Hemoglobin 11.6; Platelets 163 08/24/2019: ALT 9; BUN 18; Creatinine, Ser 1.30; Potassium 4.4; Sodium 144; TSH 1.900  Recent Lipid Panel    Component Value Date/Time   CHOL 137 08/24/2019 0944   TRIG 77 08/24/2019 0944   HDL 47 08/24/2019 0944   CHOLHDL 2.9 08/24/2019 0944   CHOLHDL 4.5 07/23/2019 0934   VLDL 27 07/23/2019 0934   LDLCALC 75 08/24/2019 0944    Physical Exam:    VS:  BP 138/76   Pulse 64   Ht 5\' 3"  (1.6 m)   Wt 179 lb 6.4 oz (81.4 kg)   SpO2 97%   BMI 31.78 kg/m     Wt Readings from Last 3 Encounters:  05/04/20 179 lb 6.4 oz (81.4 kg)  11/11/19 189 lb (85.7 kg)  08/13/19 195 lb (88.5 kg)     GEN: Patient is in no acute distress HEENT: Normal NECK: No JVD; No carotid bruits LYMPHATICS: No lymphadenopathy CARDIAC: Hear sounds regular, 2/6 systolic murmur at the apex. RESPIRATORY:  Clear to auscultation without rales, wheezing or rhonchi  ABDOMEN:  Soft, non-tender, non-distended MUSCULOSKELETAL:  No edema; No deformity  SKIN: Warm and dry NEUROLOGIC:  Alert and oriented x 3 PSYCHIATRIC:  Normal affect   Signed, Jenean Lindau, MD  05/04/2020 2:46 PM    [facility]

## 2020-05-04 NOTE — Patient Instructions (Signed)
Medication Instructions:  None *If you need a refill on your cardiac medications before your next appointment, please call your pharmacy*   Lab Work: None If you have labs (blood work) drawn today and your tests are completely normal, you will receive your results only by: Marland Kitchen MyChart Message (if you have MyChart) OR . A paper copy in the mail If you have any lab test that is abnormal or we need to change your treatment, we will call you to review the results.   Testing/Procedures: None   Follow-Up: At [facility], you and your health needs are our priority.  As part of our continuing mission to provide you with exceptional heart care, we have created designated Provider Care Teams.  These Care Teams include your primary Cardiologist (physician) and Advanced Practice Providers (APPs -  Physician Assistants and Nurse Practitioners) who all work together to provide you with the care you need, when you need it.  We recommend signing up for the patient portal called "MyChart".  Sign up information is provided on this After Visit Summary.  MyChart is used to connect with patients for Virtual Visits (Telemedicine).  Patients are able to view lab/test results, encounter notes, upcoming appointments, etc.  Non-urgent messages can be sent to your provider as well.   To learn more about what you can do with MyChart, go to [email or web address].    Your next appointment:   6 month(s)  The format for your next appointment:   In Person  Provider:   Jyl Heinz, MD   Other Instructions

## 2020-05-11 DIAGNOSIS — I10 Essential (primary) hypertension: Secondary | ICD-10-CM | POA: Diagnosis not present

## 2020-05-11 DIAGNOSIS — N1831 Chronic kidney disease, stage 3a: Secondary | ICD-10-CM | POA: Diagnosis not present

## 2020-05-11 DIAGNOSIS — I2510 Atherosclerotic heart disease of native coronary artery without angina pectoris: Secondary | ICD-10-CM | POA: Diagnosis not present

## 2020-05-11 DIAGNOSIS — F4321 Adjustment disorder with depressed mood: Secondary | ICD-10-CM | POA: Diagnosis not present

## 2020-05-11 DIAGNOSIS — E1121 Type 2 diabetes mellitus with diabetic nephropathy: Secondary | ICD-10-CM | POA: Diagnosis not present

## 2020-05-11 DIAGNOSIS — Z6832 Body mass index (BMI) 32.0-32.9, adult: Secondary | ICD-10-CM | POA: Diagnosis not present

## 2020-05-11 NOTE — Addendum Note (Signed)
Addended by: Truddie Hidden on: 05/11/2020 10:07 AM   Modules accepted: Orders

## 2020-05-12 ENCOUNTER — Telehealth: Payer: Self-pay

## 2020-05-12 LAB — CBC WITH DIFFERENTIAL/PLATELET
Basophils Absolute: 0.1 10*3/uL (ref 0.0–0.2)
Basos: 1 %
EOS (ABSOLUTE): 0.5 10*3/uL — ABNORMAL HIGH (ref 0.0–0.4)
Eos: 7 %
Hematocrit: 36 % (ref 34.0–46.6)
Hemoglobin: 11.6 g/dL (ref 11.1–15.9)
Immature Grans (Abs): 0 10*3/uL (ref 0.0–0.1)
Immature Granulocytes: 0 %
Lymphocytes Absolute: 2 10*3/uL (ref 0.7–3.1)
Lymphs: 28 %
MCH: 30.1 pg (ref 26.6–33.0)
MCHC: 32.2 g/dL (ref 31.5–35.7)
MCV: 93 fL (ref 79–97)
Monocytes Absolute: 0.5 10*3/uL (ref 0.1–0.9)
Monocytes: 7 %
Neutrophils Absolute: 4 10*3/uL (ref 1.4–7.0)
Neutrophils: 57 %
Platelets: 141 10*3/uL — ABNORMAL LOW (ref 150–450)
RBC: 3.86 x10E6/uL (ref 3.77–5.28)
RDW: 13.3 % (ref 11.7–15.4)
WBC: 7.1 10*3/uL (ref 3.4–10.8)

## 2020-05-12 LAB — HEPATIC FUNCTION PANEL
ALT: 10 IU/L (ref 0–32)
AST: 12 IU/L (ref 0–40)
Albumin: 4.3 g/dL (ref 3.6–4.6)
Alkaline Phosphatase: 67 IU/L (ref 48–121)
Bilirubin Total: 0.3 mg/dL (ref 0.0–1.2)
Bilirubin, Direct: 0.1 mg/dL (ref 0.00–0.40)
Total Protein: 6.8 g/dL (ref 6.0–8.5)

## 2020-05-12 LAB — LIPID PANEL
Chol/HDL Ratio: 3.6 ratio (ref 0.0–4.4)
Cholesterol, Total: 177 mg/dL (ref 100–199)
HDL: 49 mg/dL (ref >39)
LDL Chol Calc (NIH): 115 mg/dL — ABNORMAL HIGH (ref 0–99)
Triglycerides: 67 mg/dL (ref 0–149)
VLDL Cholesterol Cal: 13 mg/dL (ref 5–40)

## 2020-05-12 LAB — BASIC METABOLIC PANEL
BUN/Creatinine Ratio: 16 (ref 12–28)
BUN: 24 mg/dL (ref 8–27)
CO2: 20 mmol/L (ref 20–29)
Calcium: 10.1 mg/dL (ref 8.7–10.3)
Chloride: 110 mmol/L — ABNORMAL HIGH (ref 96–106)
Creatinine, Ser: 1.46 mg/dL — ABNORMAL HIGH (ref 0.57–1.00)
GFR calc Af Amer: 39 mL/min/{1.73_m2} — ABNORMAL LOW (ref >59)
GFR calc non Af Amer: 34 mL/min/{1.73_m2} — ABNORMAL LOW (ref >59)
Glucose: 62 mg/dL — ABNORMAL LOW (ref 65–99)
Potassium: 4.3 mmol/L (ref 3.5–5.2)
Sodium: 146 mmol/L — ABNORMAL HIGH (ref 134–144)

## 2020-05-12 LAB — TSH: TSH: 4.28 u[IU]/mL (ref 0.450–4.500)

## 2020-05-12 NOTE — Telephone Encounter (Signed)
Left message on patients voicemail to please return our call.   

## 2020-05-12 NOTE — Telephone Encounter (Signed)
-----   Message from Jenean Lindau, MD sent at 05/12/2020  1:20 PM EDT ----- Kidney function is stable.  Double statin and liver lipid check in 6 weeks.  The results of the study is unremarkable. Please inform patient. I will discuss in detail at next appointment. Cc  primary care/referring physician Jenean Lindau, MD 05/12/2020 1:20 PM

## 2020-05-19 ENCOUNTER — Telehealth: Payer: Self-pay

## 2020-05-19 NOTE — Telephone Encounter (Signed)
Left message on patients voicemail to please return our call.   

## 2020-05-19 NOTE — Telephone Encounter (Signed)
-----   Message from Jenean Lindau, MD sent at 05/12/2020  1:20 PM EDT ----- Kidney function is stable.  Double statin and liver lipid check in 6 weeks.  The results of the study is unremarkable. Please inform patient. I will discuss in detail at next appointment. Cc  primary care/referring physician Jenean Lindau, MD 05/12/2020 1:20 PM

## 2020-05-22 MED ORDER — ROSUVASTATIN CALCIUM 5 MG PO TABS: 10.0000 mg | ORAL_TABLET | ORAL | 1 refills | Status: DC

## 2020-05-22 NOTE — Addendum Note (Signed)
Addended by: Truddie Hidden on: 05/22/2020 02:37 PM   Modules accepted: Orders

## 2020-06-30 ENCOUNTER — Telehealth: Payer: Self-pay | Admitting: Cardiology

## 2020-06-30 NOTE — Telephone Encounter (Signed)
New message:    Patient calling stating that she was to see the doctor before march becaukse the doctor changed patient medications. Please call patient and advise.

## 2020-07-03 NOTE — Telephone Encounter (Signed)
Left message for patient to call office to schedule appt.

## 2020-07-11 DIAGNOSIS — H26492 Other secondary cataract, left eye: Secondary | ICD-10-CM | POA: Diagnosis not present

## 2020-07-11 DIAGNOSIS — Z9842 Cataract extraction status, left eye: Secondary | ICD-10-CM | POA: Diagnosis not present

## 2020-07-11 DIAGNOSIS — Z961 Presence of intraocular lens: Secondary | ICD-10-CM | POA: Diagnosis not present

## 2020-07-11 DIAGNOSIS — H524 Presbyopia: Secondary | ICD-10-CM | POA: Diagnosis not present

## 2020-08-22 DIAGNOSIS — E7800 Pure hypercholesterolemia, unspecified: Secondary | ICD-10-CM | POA: Diagnosis not present

## 2020-08-22 DIAGNOSIS — E1129 Type 2 diabetes mellitus with other diabetic kidney complication: Secondary | ICD-10-CM | POA: Diagnosis not present

## 2020-08-22 DIAGNOSIS — Z6830 Body mass index (BMI) 30.0-30.9, adult: Secondary | ICD-10-CM | POA: Diagnosis not present

## 2020-08-22 DIAGNOSIS — I10 Essential (primary) hypertension: Secondary | ICD-10-CM | POA: Diagnosis not present

## 2020-09-28 ENCOUNTER — Telehealth: Payer: Self-pay | Admitting: Oncology

## 2020-09-28 DIAGNOSIS — D472 Monoclonal gammopathy: Secondary | ICD-10-CM

## 2020-09-28 NOTE — Progress Notes (Incomplete)
[facility]  [address] [place],  [place]  [address] [phone]  Clinic Day:  09/28/2020  Referring physician: Angelina Sheriff, MD   This document serves as a record of services personally performed by Hosie Poisson, MD. It was created on their behalf by [facility], a trained medical scribe. The creation of this record is based on the scribe's personal observations and the provider's statements to them.   CHIEF COMPLAINT:  CC: Monoclonal gammopathy of uncertain significance  Current Treatment:  Surveillance   HISTORY OF PRESENT ILLNESS:  Alyssa Cooper is a 80 y.o. female who I saw in July 2018 for a tiny monoclonal spike of IgG lambda.  Her quantitative immunoglobulins were normal and a urine protein electrophoresis was negative.  She does have chronic kidney disease and her creatinine was 1.3 at that time.  The monoclonal gammopathy was detected by her nephrologist.  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  I recommended that we simply monitor this.  When she was seen in 2019, the M spike was still quite low at 0.2 g per dL, and her quantitative immunoglobulins were normal.  Her husband expired on June 28, 2018.  Repeat labs in February of 2020 revealed a stable M-spike of 0.2.    She is here for annual follow up and states that she had 2 cardiac stents placed back in December 2020 by Dr. Geraldo Pitter.  She notes issues with her shoulders, and states that she needs to undergo a reverse shoulder replacement on the right.  She has severe arthralgias of her shoulders, and is not able to fully lift her right arm, but still has a fairly good range of motion.  She notes increased anxiety due to her son being diagnosed with renal cancer.  He will be going to [facility].  Otherwise, she has been well.  Her blood counts are unremarkable.  Chemistries are unremarkable except for a BUN of 27, and a creatinine of 1.5, previously 1.3.  Her  appetite is good, and she has lost 8 1/2 pounds since her last visit.  She denies fever, or chills.  She denies nausea, vomiting, bowel issues, or abdominal pain.  She denies sore throat, cough, dyspnea, or chest pain.  In addition to her normal medications, she is taking furosemide 20 mg daily as needed, rosuvastatin 5 mg two tablets weekly, metformin 500 mg twice daily, and nitroglycerin 0.4 mg as needed.  INTERVAL HISTORY:  Alyssa Cooper is here for annual follow up ***.     Her  appetite is good, and she has gained/lost _ pounds since her last visit.  She denies fever, chills or other signs of infection.  She denies nausea, vomiting, bowel issues, or abdominal pain.  She denies sore throat, cough, dyspnea, or chest pain.  REVIEW OF SYSTEMS:  Review of Systems - Oncology   VITALS:  There were no vitals taken for this visit.  Wt Readings from Last 3 Encounters:  05/04/20 179 lb 6.4 oz (81.4 kg)  11/11/19 189 lb (85.7 kg)  08/13/19 195 lb (88.5 kg)    There is no height or weight on file to calculate BMI.  Performance status (ECOG): {CHL ONC [id number]  PHYSICAL EXAM:  Physical Exam  LABS:   CBC Latest Ref Rng & Units 05/11/2020 07/23/2019 07/14/2019  WBC 3.4 - 10.8 x10E3/uL 7.1 7.8 8.4  Hemoglobin 11.1 - 15.9 g/dL 11.6 11.6(L) 13.3  Hematocrit 34.0 - 46.6 % 36.0 35.0(L)  40.0  Platelets 150 - 450 x10E3/uL 141(L) 163 165   CMP Latest Ref Rng & Units 05/11/2020 08/24/2019 08/13/2019  Glucose 65 - 99 mg/dL 62(L) 73 95  BUN 8 - 27 mg/dL 24 18 24   Creatinine 0.57 - 1.00 mg/dL 1.46(H) 1.30(H) 1.59(H)  Sodium 134 - 144 mmol/L 146(H) 144 143  Potassium 3.5 - 5.2 mmol/L 4.3 4.4 4.0  Chloride 96 - 106 mmol/L 110(H) 105 105  CO2 20 - 29 mmol/L 20 23 20   Calcium 8.7 - 10.3 mg/dL 10.1 9.8 10.4(H)  Total Protein 6.0 - 8.5 g/dL 6.8 6.7 -  Total Bilirubin 0.0 - 1.2 mg/dL 0.3 0.4 -  Alkaline Phos 48 - 121 IU/L 67 74 -  AST 0 - 40 IU/L 12 10 -  ALT 0 - 32 IU/L 10 9 -     No results found for:  CEA1 / No results found for: CEA1 No results found for: PSA1 No results found for: [id number] No results found for: [id number]  No results found for: TOTALPROTELP, ALBUMINELP, A1GS, A2GS, BETS, BETA2SER, GAMS, MSPIKE, SPEI No results found for: TIBC, FERRITIN, IRONPCTSAT No results found for: LDH   STUDIES:  No results found.   Allergies:  Allergies  Allergen Reactions  . Penicillins Hives    Did it involve swelling of the face/tongue/throat, SOB, or low BP? No Did it involve sudden or severe rash/hives, skin peeling, or any reaction on the inside of your mouth or nose? Yes Did you need to seek medical attention at a hospital or doctor's office? Yes When did it last happen?in her 68s? If all above answers are "NO", may proceed with cephalosporin use.   . Atorvastatin Other (See Comments)    Joint pain  . Ezetimibe Other (See Comments)    Muscle aches  Muscle aches   . Motrin [Ibuprofen]   . Nsaids   . Pitavastatin Other (See Comments)  . Tramadol Other (See Comments)  . Bactrim [Sulfamethoxazole-Trimethoprim] Rash  . Furosemide Rash    Current Medications: Current Outpatient Medications  Medication Sig Dispense Refill  . amLODipine (NORVASC) 10 MG tablet Take 1 tablet by mouth once daily 90 tablet 3  . aspirin EC 81 MG tablet Take 81 mg by mouth daily.    . carvedilol (COREG) 25 MG tablet Take 1 tablet by mouth 2 (two) times daily with a meal.     . cloNIDine (CATAPRES) 0.1 MG tablet Take 0.1 mg by mouth at bedtime.    . clopidogrel (PLAVIX) 75 MG tablet Take 75 mg by mouth daily.    . ergocalciferol (VITAMIN D2) 1.25 MG (50000 UT) capsule Take 50,000 Units by mouth 2 (two) times a week.    . furosemide (LASIX) 20 MG tablet Take 20 mg by mouth daily as needed.    Marland Kitchen LANTUS SOLOSTAR 100 UNIT/ML Solostar Pen Inject 30 Units into the skin at bedtime.    . liraglutide (VICTOZA) 18 MG/3ML SOPN Inject 1.8 mg into the skin at bedtime.     . metFORMIN (GLUCOPHAGE) 500 MG tablet  Take 500 mg by mouth 2 (two) times daily.    . nitroGLYCERIN (NITROSTAT) 0.4 MG SL tablet Place 1 tablet (0.4 mg total) under the tongue every 5 (five) minutes as needed. 25 tablet 3  . quinapril (ACCUPRIL) 40 MG tablet Take 40 mg by mouth daily.     . rosuvastatin (CRESTOR) 5 MG tablet Take 2 tablets (10 mg total) by mouth 2 (two) times a week. At bedtime ONLY 48  tablet 1   No current facility-administered medications for this visit.     ASSESSMENT & PLAN:   Assessment:   1.  Benign monoclonal gammopathy of uncertain significance.  We simply ned to check this on a yearly basis.  Plan: I will see her back in 1 year with CBC, comprehensive metabolic profile, serum protein electrophoresis, and quantitative immunoglobulins.  She understands and agrees with this plan of care.   I provided *** minutes (9:44 AM - 9:44 AM) of face-to-face time during this this encounter and > 50% was spent counseling as documented under my assessment and plan.    Derwood Kaplan, MD [facility] AT [facility] [address] [place] [address] Dept: [phone] Dept Fax: [phone]   I, Rita Ohara, am acting as scribe for Derwood Kaplan, MD  I have reviewed this report as typed by the medical scribe, and it is complete and accurate.

## 2020-09-28 NOTE — Telephone Encounter (Signed)
09/28/20 Spoke with patient and rs appt due to weather

## 2020-09-29 ENCOUNTER — Inpatient Hospital Stay: Payer: Medicare Other

## 2020-09-29 ENCOUNTER — Inpatient Hospital Stay: Payer: Medicare Other | Admitting: Oncology

## 2020-10-18 ENCOUNTER — Telehealth: Payer: Self-pay | Admitting: Oncology

## 2020-10-18 NOTE — Telephone Encounter (Signed)
Rescheduled patient's 2/11 Appt Time to Labs 2:00 pm - Follow Up 2:30 pm  Patient notified

## 2020-10-18 NOTE — Progress Notes (Incomplete)
[facility]  [address] [place],  [place]  [address] [phone]  Clinic Day:  10/18/2020  Referring physician: Angelina Sheriff, MD   This document serves as a record of services personally performed by Hosie Poisson, MD. It was created on their behalf by [facility], a trained medical scribe. The creation of this record is based on the scribe's personal observations and the provider's statements to them.  CHIEF COMPLAINT:  CC: Monoclonal gammopathy of uncertain significance  Current Treatment:  Survaillance   HISTORY OF PRESENT ILLNESS:  Alyssa Cooper is a 80 y.o. female who I saw in July 2018 for a tiny monoclonal spike of IgG lambda.  Her quantitative immunoglobulins were normal and a urine protein electrophoresis was negative.  She does have chronic kidney disease and her creatinine was 1.3 at that time.  The monoclonal gammopathy was detected by her nephrologist.  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  I recommended that we simply monitor this.  When she was seen in 2019, the M spike was still quite low at 0.2 g per dL, and her quantitative immunoglobulins were normal.  Her husband expired on June 28, 2018.  Repeat labs in February of 2020 revealed a stable M-spike of 0.2.    She is here for annual follow up and states that she had 2 cardiac stents placed back in December 2020 by Dr. Geraldo Pitter.  She notes issues with her shoulders, and states that she needs to undergo a reverse shoulder replacement on the right.  She has severe arthralgias of her shoulders, and is not able to fully lift her right arm, but still has a fairly good range of motion.  She notes increased anxiety due to her son being diagnosed with renal cancer.  He will be going to [facility].  Otherwise, she has been well.  Her blood counts are unremarkable.  Chemistries are unremarkable except for a BUN of 27, and a creatinine of 1.5, previously 1.3.  Her appetite  is good, and she has lost 8 1/2 pounds since her last visit.  She denies fever, or chills.  She denies nausea, vomiting, bowel issues, or abdominal pain.  She denies sore throat, cough, dyspnea, or chest pain.  In addition to her normal medications, she is taking furosemide 20 mg daily as needed, rosuvastatin 5 mg two tablets weekly, metformin 500 mg twice daily, and nitroglycerin 0.4 mg as needed.  INTERVAL HISTORY:  Alyssa Cooper is here for annual follow up ***.   Her  appetite is good, and she has gained/lost _ pounds since her last visit.  She denies fever, chills or other signs of infection.  She denies nausea, vomiting, bowel issues, or abdominal pain.  She denies sore throat, cough, dyspnea, or chest pain.   REVIEW OF SYSTEMS:  Review of Systems - Oncology   VITALS:  There were no vitals taken for this visit.  Wt Readings from Last 3 Encounters:  09/30/19 185 lb 7 oz (84.1 kg)  05/04/20 179 lb 6.4 oz (81.4 kg)  11/11/19 189 lb (85.7 kg)    There is no height or weight on file to calculate BMI.  Performance status (ECOG): {CHL ONC [id number]  PHYSICAL EXAM:  Physical Exam  LABS:   CBC Latest Ref Rng & Units 05/11/2020 07/23/2019 07/14/2019  WBC 3.4 - 10.8 x10E3/uL 7.1 7.8 8.4  Hemoglobin 11.1 - 15.9 g/dL 11.6 11.6(L) 13.3  Hematocrit 34.0 - 46.6 % 36.0 35.0(L)  40.0  Platelets 150 - 450 x10E3/uL 141(L) 163 165   CMP Latest Ref Rng & Units 05/11/2020 08/24/2019 08/13/2019  Glucose 65 - 99 mg/dL 62(L) 73 95  BUN 8 - 27 mg/dL 24 18 24   Creatinine 0.57 - 1.00 mg/dL 1.46(H) 1.30(H) 1.59(H)  Sodium 134 - 144 mmol/L 146(H) 144 143  Potassium 3.5 - 5.2 mmol/L 4.3 4.4 4.0  Chloride 96 - 106 mmol/L 110(H) 105 105  CO2 20 - 29 mmol/L 20 23 20   Calcium 8.7 - 10.3 mg/dL 10.1 9.8 10.4(H)  Total Protein 6.0 - 8.5 g/dL 6.8 6.7 -  Total Bilirubin 0.0 - 1.2 mg/dL 0.3 0.4 -  Alkaline Phos 48 - 121 IU/L 67 74 -  AST 0 - 40 IU/L 12 10 -  ALT 0 - 32 IU/L 10 9 -     No results found for: CEA1 /  No results found for: CEA1 No results found for: PSA1 No results found for: OHY073 No results found for: XTG626  No results found for: TOTALPROTELP, ALBUMINELP, A1GS, A2GS, BETS, BETA2SER, GAMS, MSPIKE, SPEI No results found for: TIBC, FERRITIN, IRONPCTSAT No results found for: LDH   STUDIES:  No results found.   Allergies:  Allergies  Allergen Reactions  . Penicillins Hives    Did it involve swelling of the face/tongue/throat, SOB, or low BP? No Did it involve sudden or severe rash/hives, skin peeling, or any reaction on the inside of your mouth or nose? Yes Did you need to seek medical attention at a hospital or doctor's office? Yes When did it last happen?in her 35s? If all above answers are "NO", may proceed with cephalosporin use.   . Atorvastatin Other (See Comments)    Joint pain  . Ezetimibe Other (See Comments)    Muscle aches  Muscle aches   . Motrin [Ibuprofen]   . Nsaids   . Pitavastatin Other (See Comments)  . Tramadol Other (See Comments)  . Bactrim [Sulfamethoxazole-Trimethoprim] Rash  . Furosemide Rash    Current Medications: Current Outpatient Medications  Medication Sig Dispense Refill  . amLODipine (NORVASC) 10 MG tablet Take 1 tablet by mouth once daily 90 tablet 3  . aspirin EC 81 MG tablet Take 81 mg by mouth daily.    . carvedilol (COREG) 25 MG tablet Take 1 tablet by mouth 2 (two) times daily with a meal.     . cloNIDine (CATAPRES) 0.1 MG tablet Take 0.1 mg by mouth at bedtime.    . clopidogrel (PLAVIX) 75 MG tablet Take 75 mg by mouth daily.    . ergocalciferol (VITAMIN D2) 1.25 MG (50000 UT) capsule Take 50,000 Units by mouth 2 (two) times a week.    . furosemide (LASIX) 20 MG tablet Take 20 mg by mouth daily as needed.    Marland Kitchen LANTUS SOLOSTAR 100 UNIT/ML Solostar Pen Inject 30 Units into the skin at bedtime.    . liraglutide (VICTOZA) 18 MG/3ML SOPN Inject 1.8 mg into the skin at bedtime.     . metFORMIN (GLUCOPHAGE) 500 MG tablet Take 500  mg by mouth 2 (two) times daily.    . nitroGLYCERIN (NITROSTAT) 0.4 MG SL tablet Place 1 tablet (0.4 mg total) under the tongue every 5 (five) minutes as needed. 25 tablet 3  . quinapril (ACCUPRIL) 40 MG tablet Take 40 mg by mouth daily.     . rosuvastatin (CRESTOR) 5 MG tablet Take 2 tablets (10 mg total) by mouth 2 (two) times a week. At bedtime ONLY 48  tablet 1   No current facility-administered medications for this visit.     ASSESSMENT & PLAN:   Assessment:   1.  Benign monoclonal gammopathy of uncertain significance.    Plan: I will call her with the results of her chemistries and other tests.  We simply need to check this on a yearly basis and so I will see her back in 1 year with CBC, comprehensive metabolic profile, serum protein electrophoresis, and quantitative immunoglobulins.  She understands and agrees with this plan of care.   I provided *** minutes (10:22 AM - 10:22 AM) of face-to-face time during this this encounter and > 50% was spent counseling as documented under my assessment and plan.    Derwood Kaplan, MD [facility] [address] [place] [address] Dept: [phone] Dept Fax: [phone]   I, Rita Ohara, am acting as scribe for Derwood Kaplan, MD  I have reviewed this report as typed by the medical scribe, and it is complete and accurate.

## 2020-10-19 NOTE — Progress Notes (Signed)
Alyssa Cooper  [address] [place],  [place]  [address] [phone]  Clinic Day:  10/20/2020  Referring physician: Angelina Sheriff, MD   CHIEF COMPLAINT:  CC: An 80 year old female with benign monoclonal gammopathy here for one year evaluation of disease status  Current Treatment:  Surveillance   HISTORY OF PRESENT ILLNESS:  Alyssa Cooper is a 80 y.o. female with a history of a tiny monoclonal spike of IgG lambda.  Her quantitative immunoglobulins were normal and a urine protein electrophoresis was negative.  She does have chronic kidney disease and her creatinine was 1.3 at that time.  The monoclonal gammopathy was detected by her nephrologist.  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  We recommended that we simply monitor this.  When she was seen in 2019, the M spike was still quite low at 0.2 g per dL, and her quantitative immunoglobulins were normal.  Her husband expired on June 28, 2018.  Repeat labs in February of 2020 revealed a stable M-spike of 0.2.    INTERVAL HISTORY:  Alyssa Cooper is here today for routine follow up of her benign monoclonal gammopathy. She has been well since her last visit. She did lose her son last August to cancer and has had some anxiety and depression surrounding this event, but says she is managing. She has a good support system and does not require medications. She denies fever, chills, nausea or vomiting. She is completely vaccinated and denies shortness of breath, chest pain or cough. She continues to be very active. She denies issue with bowel or bladder. Her appetite is good and her weight has decreased since last visit; however, she has been trying to eat healthier and lose weight to better manage her diabetes. CBC and CMP are unremarkable today other than an elevated creatinine at 1.6 which is stable since last visit.   REVIEW OF SYSTEMS:  Review of Systems  Constitutional: Negative for appetite change,  chills, diaphoresis, fatigue, fever and unexpected weight change.  HENT:   Negative for hearing loss, lump/mass, mouth sores, nosebleeds, sore throat, tinnitus, trouble swallowing and voice change.   Eyes: Negative for eye problems and icterus.  Respiratory: Negative for chest tightness, cough, hemoptysis, shortness of breath and wheezing.   Cardiovascular: Negative for chest pain, leg swelling and palpitations.  Gastrointestinal: Negative for abdominal distention, abdominal pain, blood in stool, constipation, diarrhea, nausea, rectal pain and vomiting.  Endocrine: Negative for hot flashes.  Genitourinary: Negative for bladder incontinence, difficulty urinating, dyspareunia, dysuria, frequency, hematuria and nocturia.   Musculoskeletal: Negative for arthralgias, back pain, flank pain, gait problem, myalgias, neck pain and neck stiffness.  Skin: Negative for itching, rash and wound.  Neurological: Negative for dizziness, extremity weakness, gait problem, headaches, light-headedness, numbness, seizures and speech difficulty.  Hematological: Negative for adenopathy. Does not bruise/bleed easily.  Psychiatric/Behavioral: Negative for confusion, decreased concentration, depression, sleep disturbance and suicidal ideas. The patient is not nervous/anxious.      VITALS:  Blood pressure (!) 172/87, pulse 80, temperature 97.6 F (36.4 C), temperature source Oral, resp. rate 18, height 5\' 3"  (1.6 m), weight 168 lb 14.4 oz (76.6 kg), SpO2 95 %.  Wt Readings from Last 3 Encounters:  10/20/20 168 lb 14.4 oz (76.6 kg)  09/30/19 185 lb 7 oz (84.1 kg)  05/04/20 179 lb 6.4 oz (81.4 kg)    Body mass index is 29.92 kg/m.  Performance status (ECOG): 0 - Asymptomatic  PHYSICAL EXAM:  Physical Exam Constitutional:      General: She is not in acute distress.    Appearance: Normal appearance. She is normal weight. She is not ill-appearing, toxic-appearing or diaphoretic.  HENT:     Head: Normocephalic and  atraumatic.     Nose: Nose normal. No congestion or rhinorrhea.     Mouth/Throat:     Mouth: Mucous membranes are moist.     Pharynx: Oropharynx is clear. No oropharyngeal exudate or posterior oropharyngeal erythema.  Eyes:     General: No scleral icterus.       Right eye: No discharge.        Left eye: No discharge.     Extraocular Movements: Extraocular movements intact.     Conjunctiva/sclera: Conjunctivae normal.     Pupils: Pupils are equal, round, and reactive to light.  Neck:     Vascular: No carotid bruit.  Cardiovascular:     Rate and Rhythm: Normal rate and regular rhythm.     Heart sounds: No murmur heard. No friction rub. No gallop.   Pulmonary:     Effort: Pulmonary effort is normal. No respiratory distress.     Breath sounds: Normal breath sounds. No stridor. No wheezing, rhonchi or rales.  Chest:     Chest wall: No tenderness.  Abdominal:     General: Abdomen is flat. Bowel sounds are normal. There is no distension.     Palpations: There is no mass.     Tenderness: There is no abdominal tenderness. There is no right CVA tenderness, left CVA tenderness, guarding or rebound.     Hernia: No hernia is present.  Musculoskeletal:        General: No swelling, tenderness, deformity or signs of injury. Normal range of motion.     Cervical back: Normal range of motion and neck supple. No rigidity or tenderness.     Right lower leg: No edema.     Left lower leg: No edema.  Lymphadenopathy:     Cervical: No cervical adenopathy.  Skin:    General: Skin is warm and dry.     Capillary Refill: Capillary refill takes less than 2 seconds.     Coloration: Skin is not jaundiced or pale.     Findings: No bruising, erythema, lesion or rash.  Neurological:     General: No focal deficit present.     Mental Status: She is alert and oriented to person, place, and time. Mental status is at baseline.     Cranial Nerves: No cranial nerve deficit.     Sensory: No sensory deficit.      Motor: No weakness.     Coordination: Coordination normal.     Gait: Gait normal.     Deep Tendon Reflexes: Reflexes normal.  Psychiatric:        Mood and Affect: Mood normal.        Behavior: Behavior normal.        Thought Content: Thought content normal.        Judgment: Judgment normal.     LABS:   CBC Latest Ref Rng & Units 10/20/2020 05/11/2020 07/23/2019  WBC - 9.1 7.1 7.8  Hemoglobin 12.0 - 16.0 12.4 11.6 11.6(L)  Hematocrit 36 - 46 37 36.0 35.0(L)  Platelets 150 - 399 153 141(L) 163   CMP Latest Ref Rng & Units 10/20/2020 05/11/2020 08/24/2019  Glucose 65 - 99 mg/dL - 62(L) 73  BUN 4 - 21 26(A) 24 18  Creatinine 0.5 - 1.1 1.6(A)  1.46(H) 1.30(H)  Sodium 137 - 147 141 146(H) 144  Potassium 3.4 - 5.3 4.5 4.3 4.4  Chloride 99 - 108 107 110(H) 105  CO2 13 - 22 26(A) 20 23  Calcium 8.7 - 10.7 10.0 10.1 9.8  Total Protein 6.0 - 8.5 g/dL - 6.8 6.7  Total Bilirubin 0.0 - 1.2 mg/dL - 0.3 0.4  Alkaline Phos 25 - 125 57 67 74  AST 13 - 35 22 12 10   ALT 7 - 35 13 10 9      No results found for: CEA1 / No results found for: CEA1 No results found for: PSA1 No results found for: GYI948 No results found for: NIO270  No results found for: TOTALPROTELP, ALBUMINELP, A1GS, A2GS, BETS, BETA2SER, GAMS, MSPIKE, SPEI No results found for: TIBC, FERRITIN, IRONPCTSAT No results found for: LDH  STUDIES:  No results found.    HISTORY:   Past Medical History:  Diagnosis Date  . Abnormal nuclear cardiac imaging test 06/11/2019  . Abnormal stress test 07/22/2019  . Anemia, pernicious   . CAD (coronary artery disease) 05/26/2019  . CAD S/P percutaneous coronary angioplasty 07/23/2019  . Chronic ischemic heart disease   . Chronic kidney disease   . Chronic kidney disease, stage 3 (moderate) 12/01/2018  . Diabetes mellitus due to underlying condition with unspecified complications ([place]) 3/50/0938  . Helicobacter heilmannii gastritis   . History of melanoma   . Hx of CABG 05/26/2019  .  Hypertension 12/01/2018  . Obesity   . Osteoarthritis   . Preoperative cardiovascular examination 06/11/2019  . Pure hypercholesterolemia   . Renal osteodystrophy 12/01/2018  . Type 2 diabetes mellitus with diabetic chronic kidney disease ([place]) 12/01/2018  . Vitamin D deficiency     Past Surgical History:  Procedure Laterality Date  . ABDOMINAL HYSTERECTOMY    . BACK SURGERY  2011  . BUNIONECTOMY    . BYPASS GRAFT  2000  . CATARACT EXTRACTION    . CORONARY STENT INTERVENTION N/A 07/22/2019   Procedure: CORONARY STENT INTERVENTION;  Surgeon: Jettie Booze, MD;  Location: [place] [facility];  Service: Cardiovascular;  Laterality: N/A;  . LEFT HEART CATH AND CORS/GRAFTS ANGIOGRAPHY N/A 07/22/2019   Procedure: LEFT HEART CATH AND CORS/GRAFTS ANGIOGRAPHY;  Surgeon: Jettie Booze, MD;  Location: [facility];  Service: Cardiovascular;  Laterality: N/A;  . PARTIAL HYSTERECTOMY    . REPLACEMENT TOTAL KNEE      Family History  Problem Relation Age of Onset  . Diabetes Father   . Diabetes Sister   . Lung cancer Brother   . Cancer Son     Social History:  reports that she has never smoked. She has never used smokeless tobacco. She reports that she does not drink alcohol and does not use drugs.The patient is alone  today.  Allergies:  Allergies  Allergen Reactions  . Penicillins Hives    Did it involve swelling of the face/tongue/throat, SOB, or low BP? No Did it involve sudden or severe rash/hives, skin peeling, or any reaction on the inside of your mouth or nose? Yes Did you need to seek medical attention at a hospital or doctor's office? Yes When did it last happen?in her 90s? If all above answers are "NO", may proceed with cephalosporin use.   . Atorvastatin Other (See Comments)    Joint pain  . Ezetimibe Other (See Comments)    Muscle aches  Muscle aches   . Motrin [Ibuprofen]   . Nsaids   .  Pitavastatin Other (See Comments)  . Tramadol Other (See  Comments)  . Bactrim [Sulfamethoxazole-Trimethoprim] Rash  . Furosemide Rash    Current Medications: Current Outpatient Medications  Medication Sig Dispense Refill  . amLODipine (NORVASC) 10 MG tablet Take 1 tablet by mouth once daily 90 tablet 3  . aspirin EC 81 MG tablet Take 81 mg by mouth daily.    . carvedilol (COREG) 25 MG tablet Take 1 tablet by mouth 2 (two) times daily with a meal.     . cloNIDine (CATAPRES) 0.1 MG tablet Take 0.1 mg by mouth at bedtime.    . clopidogrel (PLAVIX) 75 MG tablet Take 75 mg by mouth daily.    . ergocalciferol (VITAMIN D2) 1.25 MG (50000 UT) capsule Take 50,000 Units by mouth 2 (two) times a week.    Marland Kitchen LANTUS SOLOSTAR 100 UNIT/ML Solostar Pen Inject 30 Units into the skin at bedtime.    . liraglutide (VICTOZA) 18 MG/3ML SOPN Inject 1.8 mg into the skin at bedtime.     . nitroGLYCERIN (NITROSTAT) 0.4 MG SL tablet Place 1 tablet (0.4 mg total) under the tongue every 5 (five) minutes as needed. 25 tablet 3  . quinapril (ACCUPRIL) 40 MG tablet Take 40 mg by mouth daily.     . rosuvastatin (CRESTOR) 5 MG tablet Take 2 tablets (10 mg total) by mouth 2 (two) times a week. At bedtime ONLY 48 tablet 1   No current facility-administered medications for this visit.     ASSESSMENT & PLAN:   Assessment:  Alyssa Cooper is a 80 y.o. female with benign monoclonal gammopathy. She continues to do well.   Plan: She will return to clinic in one year for repeat CBC, CMP, serum protein electrophoresis, quantitative immunoglobulins and evaluation.     The patient understands the plans discussed today and is in agreement with them.  She knows to contact our office if she develops concerns prior to her next appointment.     Melodye Ped, NP

## 2020-10-20 ENCOUNTER — Other Ambulatory Visit: Payer: Self-pay

## 2020-10-20 ENCOUNTER — Telehealth: Payer: Self-pay | Admitting: Hematology and Oncology

## 2020-10-20 ENCOUNTER — Other Ambulatory Visit: Payer: Self-pay | Admitting: Hematology and Oncology

## 2020-10-20 ENCOUNTER — Inpatient Hospital Stay: Payer: Medicare Other | Attending: Oncology

## 2020-10-20 ENCOUNTER — Encounter: Payer: Self-pay | Admitting: Hematology and Oncology

## 2020-10-20 ENCOUNTER — Inpatient Hospital Stay (INDEPENDENT_AMBULATORY_CARE_PROVIDER_SITE_OTHER): Payer: Medicare Other | Admitting: Hematology and Oncology

## 2020-10-20 ENCOUNTER — Inpatient Hospital Stay: Payer: Medicare Other | Admitting: Oncology

## 2020-10-20 VITALS — BP 172/87 | HR 80 | Temp 97.6°F | Resp 18 | Ht 63.0 in | Wt 168.9 lb

## 2020-10-20 DIAGNOSIS — D472 Monoclonal gammopathy: Secondary | ICD-10-CM | POA: Insufficient documentation

## 2020-10-20 LAB — HEPATIC FUNCTION PANEL
ALT: 13 (ref 7–35)
AST: 22 (ref 13–35)
Alkaline Phosphatase: 57 (ref 25–125)
Bilirubin, Total: 0.5

## 2020-10-20 LAB — CBC AND DIFFERENTIAL
HCT: 37 (ref 36–46)
Hemoglobin: 12.4 (ref 12.0–16.0)
Neutrophils Absolute: 6.55
Platelets: 153 (ref 150–399)
WBC: 9.1

## 2020-10-20 LAB — CBC: RBC: 4.07 (ref 3.87–5.11)

## 2020-10-20 LAB — BASIC METABOLIC PANEL
BUN: 26 — AB (ref 4–21)
CO2: 26 — AB (ref 13–22)
Chloride: 107 (ref 99–108)
Creatinine: 1.6 — AB (ref 0.5–1.1)
Glucose: 131
Potassium: 4.5 (ref 3.4–5.3)
Sodium: 141 (ref 137–147)

## 2020-10-20 LAB — COMPREHENSIVE METABOLIC PANEL
Albumin: 4.1 (ref 3.5–5.0)
Calcium: 10 (ref 8.7–10.7)

## 2020-10-20 NOTE — Telephone Encounter (Signed)
10/20/20 Next appt sched and given to patient

## 2020-10-21 LAB — IGG, IGA, IGM
IgA: 132 mg/dL (ref 64–422)
IgG (Immunoglobin G), Serum: 781 mg/dL (ref 586–1602)
IgM (Immunoglobulin M), Srm: 43 mg/dL (ref 26–217)

## 2020-10-23 LAB — PROTEIN ELECTROPHORESIS, SERUM
A/G Ratio: 1.5 (ref 0.7–1.7)
Albumin ELP: 3.7 g/dL (ref 2.9–4.4)
Alpha-1-Globulin: 0.1 g/dL (ref 0.0–0.4)
Alpha-2-Globulin: 0.8 g/dL (ref 0.4–1.0)
Beta Globulin: 0.9 g/dL (ref 0.7–1.3)
Gamma Globulin: 0.7 g/dL (ref 0.4–1.8)
Globulin, Total: 2.5 g/dL (ref 2.2–3.9)
Total Protein ELP: 6.2 g/dL (ref 6.0–8.5)

## 2020-11-07 ENCOUNTER — Ambulatory Visit: Payer: Medicare Other | Admitting: Cardiology

## 2020-11-07 ENCOUNTER — Other Ambulatory Visit: Payer: Self-pay

## 2020-11-07 ENCOUNTER — Encounter: Payer: Self-pay | Admitting: Cardiology

## 2020-11-07 VITALS — BP 148/86 | HR 81 | Ht 63.0 in | Wt 167.0 lb

## 2020-11-07 DIAGNOSIS — E088 Diabetes mellitus due to underlying condition with unspecified complications: Secondary | ICD-10-CM | POA: Diagnosis not present

## 2020-11-07 DIAGNOSIS — Z951 Presence of aortocoronary bypass graft: Secondary | ICD-10-CM

## 2020-11-07 DIAGNOSIS — E7800 Pure hypercholesterolemia, unspecified: Secondary | ICD-10-CM | POA: Diagnosis not present

## 2020-11-07 DIAGNOSIS — I2510 Atherosclerotic heart disease of native coronary artery without angina pectoris: Secondary | ICD-10-CM

## 2020-11-07 DIAGNOSIS — I10 Essential (primary) hypertension: Secondary | ICD-10-CM | POA: Diagnosis not present

## 2020-11-07 NOTE — Patient Instructions (Signed)
Medication Instructions:  No medication changes. *If you need a refill on your cardiac medications before your next appointment, please call your pharmacy*   Lab Work: Your physician recommends that you return for lab work in: next few days  You need to have labs done when you are fasting.  You can come Monday through Friday 8:30 am to 12:00 pm and 1:15 to 4:30. You do not need to make an appointment as the order has already been placed. The labs you are going to have done are BMET, TSH, LFT and Lipids.  If you have labs (blood work) drawn today and your tests are completely normal, you will receive your results only by: Marland Kitchen MyChart Message (if you have MyChart) OR . A paper copy in the mail If you have any lab test that is abnormal or we need to change your treatment, we will call you to review the results.   Testing/Procedures: None ordered   Follow-Up: At [facility], you and your health needs are our priority.  As part of our continuing mission to provide you with exceptional heart care, we have created designated Provider Care Teams.  These Care Teams include your primary Cardiologist (physician) and Advanced Practice Providers (APPs -  Physician Assistants and Nurse Practitioners) who all work together to provide you with the care you need, when you need it.  We recommend signing up for the patient portal called "MyChart".  Sign up information is provided on this After Visit Summary.  MyChart is used to connect with patients for Virtual Visits (Telemedicine).  Patients are able to view lab/test results, encounter notes, upcoming appointments, etc.  Non-urgent messages can be sent to your provider as well.   To learn more about what you can do with MyChart, go to [email or web address].    Your next appointment:   6 month(s)  The format for your next appointment:   In Person  Provider:   Jyl Heinz, MD   Other Instructions NA

## 2020-11-07 NOTE — Progress Notes (Signed)
Cardiology Office Note:    Date:  11/07/2020   ID:  [name], DOB 01/03/1941, MRN [id number]  PCP:  Angelina Sheriff, MD  Cardiologist:  Jenean Lindau, MD   Referring MD: Angelina Sheriff, MD    ASSESSMENT:    1. Coronary artery disease involving native coronary artery of native heart without angina pectoris   2. Primary hypertension   3. Pure hypercholesterolemia   4. Diabetes mellitus due to underlying condition with unspecified complications ([place])   5. Hx of CABG    PLAN:    In order of problems listed above:  1. Coronary artery disease: Secondary prevention stressed with the patient.  Importance of compliance with diet medication stressed and she vocalized understanding.  She was advised to walk at least half an hour a day and she promises to do so. 2. Essential hypertension: Blood pressure stable and diet was emphasized. 3. Mixed dyslipidemia: Lipids were reviewed from the past.  I would like for her to come back for lipid work as LDL was elevated and a few months ago.  She agrees. 4. Diabetes mellitus: Managed by primary care physician and diet was emphasized. 5. History of obesity: I congratulated her about weight loss she promises to stay this way. 6. Patient will be seen in follow-up appointment in 6 months or earlier if the patient has any concerns    Medication Adjustments/Labs and Tests Ordered: Current medicines are reviewed at length with the patient today.  Concerns regarding medicines are outlined above.  No orders of the defined types were placed in this encounter.  No orders of the defined types were placed in this encounter.    No chief complaint on file.    History of Present Illness:    Alyssa Cooper is a 80 y.o. female.  Patient has past medical history of coronary artery disease post CABG surgery, essential hypertension, dyslipidemia and diabetes mellitus.  She denies any problems at this time and takes care of activities of  daily living.  No chest pain orthopnea or PND.  At the time of my evaluation, the patient is alert awake oriented and in no distress.  Past Medical History:  Diagnosis Date  . Abnormal nuclear cardiac imaging test 06/11/2019  . Abnormal stress test 07/22/2019  . Anemia, pernicious   . CAD (coronary artery disease) 05/26/2019  . CAD S/P percutaneous coronary angioplasty 07/23/2019  . Chronic ischemic heart disease   . Chronic kidney disease   . Chronic kidney disease, stage 3 (moderate) 12/01/2018  . Comprehensive diabetic foot examination, type 2 DM, encounter for ([place]) 12/23/2019  . Diabetes mellitus due to underlying condition with unspecified complications ([place]) 04/26/2992  . Diabetes mellitus without complication ([place]) 03/24/9677  . Gammopathy, monoclonal 09/25/2016  . Helicobacter heilmannii gastritis   . History of melanoma   . Hx of CABG 05/26/2019  . Hypertension 12/01/2018  . Obesity   . Osteoarthritis   . Pain due to onychomycosis of toenail of right foot 01/13/2020  . Peroneal tendonitis, left 12/23/2019  . Preoperative cardiovascular examination 06/11/2019  . Primary osteoarthritis of left foot 12/23/2019  . Pure hypercholesterolemia   . Renal osteodystrophy 12/01/2018  . Type 2 diabetes mellitus with diabetic chronic kidney disease ([place]) 12/01/2018  . Vitamin D deficiency     Past Surgical History:  Procedure Laterality Date  . ABDOMINAL HYSTERECTOMY    . BACK SURGERY  2011  . BUNIONECTOMY    . BYPASS GRAFT  2000  .  CATARACT EXTRACTION    . CORONARY STENT INTERVENTION N/A 07/22/2019   Procedure: CORONARY STENT INTERVENTION;  Surgeon: Jettie Booze, MD;  Location: [facility];  Service: Cardiovascular;  Laterality: N/A;  . LEFT HEART CATH AND CORS/GRAFTS ANGIOGRAPHY N/A 07/22/2019   Procedure: LEFT HEART CATH AND CORS/GRAFTS ANGIOGRAPHY;  Surgeon: Jettie Booze, MD;  Location: [facility];  Service: Cardiovascular;  Laterality: N/A;  . PARTIAL  HYSTERECTOMY    . REPLACEMENT TOTAL KNEE      Current Medications: Current Meds  Medication Sig  . amLODipine (NORVASC) 10 MG tablet Take 1 tablet by mouth once daily  . aspirin EC 81 MG tablet Take 81 mg by mouth daily.  . carvedilol (COREG) 25 MG tablet Take 1 tablet by mouth 2 (two) times daily with a meal.   . cloNIDine (CATAPRES) 0.1 MG tablet Take 0.1 mg by mouth at bedtime.  . clopidogrel (PLAVIX) 75 MG tablet Take 75 mg by mouth daily.  . ergocalciferol (VITAMIN D2) 1.25 MG (50000 UT) capsule Take 50,000 Units by mouth 2 (two) times a week.  Marland Kitchen LANTUS SOLOSTAR 100 UNIT/ML Solostar Pen Inject 18 Units into the skin at bedtime.  . liraglutide (VICTOZA) 18 MG/3ML SOPN Inject 1.8 mg into the skin at bedtime.   . nitroGLYCERIN (NITROSTAT) 0.4 MG SL tablet Place 0.4 mg under the tongue every 5 (five) minutes as needed for chest pain.  Marland Kitchen quinapril (ACCUPRIL) 40 MG tablet Take 40 mg by mouth daily.   . rosuvastatin (CRESTOR) 5 MG tablet Take 5 mg by mouth 2 (two) times a week.     Allergies:   Penicillins, Atorvastatin, Ezetimibe, Motrin [ibuprofen], Nsaids, Pitavastatin, Tramadol, Bactrim [sulfamethoxazole-trimethoprim], and Furosemide   Social History   Socioeconomic History  . Marital status: Widowed    Spouse name: Not on file  . Number of children: 3  . Years of education: Not on file  . Highest education level: 10th grade  Occupational History  . Not on file  Tobacco Use  . Smoking status: Never Smoker  . Smokeless tobacco: Never Used  Vaping Use  . Vaping Use: Never used  Substance and Sexual Activity  . Alcohol use: Never  . Drug use: Never  . Sexual activity: Not on file  Other Topics Concern  . Not on file  Social History Narrative  . Not on file   Social Determinants of Health   Financial Resource Strain: Not on file  Food Insecurity: Not on file  Transportation Needs: Not on file  Physical Activity: Not on file  Stress: Not on file  Social Connections:  Not on file     Family History: The patient's family history includes Cancer in her son; Diabetes in her father and sister; Lung cancer in her brother.  ROS:   Please see the history of present illness.    All other systems reviewed and are negative.  EKGs/Labs/Other Studies Reviewed:    The following studies were reviewed today: I discussed my findings with the patient at length   Recent Labs: 05/11/2020: TSH 4.280 10/20/2020: ALT 13; BUN 26; Creatinine 1.6; Hemoglobin 12.4; Platelets 153; Potassium 4.5; Sodium 141  Recent Lipid Panel    Component Value Date/Time   CHOL 177 05/11/2020 1013   TRIG 67 05/11/2020 1013   HDL 49 05/11/2020 1013   CHOLHDL 3.6 05/11/2020 1013   CHOLHDL 4.5 07/23/2019 0934   VLDL 27 07/23/2019 0934   LDLCALC 115 (H) 05/11/2020 1013  Physical Exam:    VS:  BP (!) 148/86   Pulse 81   Ht 5\' 3"  (1.6 m)   Wt 167 lb (75.8 kg)   SpO2 96%   BMI 29.58 kg/m     Wt Readings from Last 3 Encounters:  11/07/20 167 lb (75.8 kg)  10/20/20 168 lb 14.4 oz (76.6 kg)  09/30/19 185 lb 7 oz (84.1 kg)     GEN: Patient is in no acute distress HEENT: Normal NECK: No JVD; No carotid bruits LYMPHATICS: No lymphadenopathy CARDIAC: Hear sounds regular, 2/6 systolic murmur at the apex. RESPIRATORY:  Clear to auscultation without rales, wheezing or rhonchi  ABDOMEN: Soft, non-tender, non-distended MUSCULOSKELETAL:  No edema; No deformity  SKIN: Warm and dry NEUROLOGIC:  Alert and oriented x 3 PSYCHIATRIC:  Normal affect   Signed, Jenean Lindau, MD  11/07/2020 3:52 PM    [facility]

## 2020-11-08 DIAGNOSIS — I10 Essential (primary) hypertension: Secondary | ICD-10-CM | POA: Diagnosis not present

## 2020-11-08 DIAGNOSIS — E088 Diabetes mellitus due to underlying condition with unspecified complications: Secondary | ICD-10-CM | POA: Diagnosis not present

## 2020-11-08 DIAGNOSIS — E7800 Pure hypercholesterolemia, unspecified: Secondary | ICD-10-CM | POA: Diagnosis not present

## 2020-11-08 DIAGNOSIS — I2510 Atherosclerotic heart disease of native coronary artery without angina pectoris: Secondary | ICD-10-CM | POA: Diagnosis not present

## 2020-11-09 LAB — LIPID PANEL
Chol/HDL Ratio: 4 ratio (ref 0.0–4.4)
Cholesterol, Total: 157 mg/dL (ref 100–199)
HDL: 39 mg/dL — ABNORMAL LOW (ref >39)
LDL Chol Calc (NIH): 98 mg/dL (ref 0–99)
Triglycerides: 109 mg/dL (ref 0–149)
VLDL Cholesterol Cal: 20 mg/dL (ref 5–40)

## 2020-11-09 LAB — BASIC METABOLIC PANEL
BUN/Creatinine Ratio: 15 (ref 12–28)
BUN: 22 mg/dL (ref 8–27)
CO2: 23 mmol/L (ref 20–29)
Calcium: 10.2 mg/dL (ref 8.7–10.3)
Chloride: 107 mmol/L — ABNORMAL HIGH (ref 96–106)
Creatinine, Ser: 1.46 mg/dL — ABNORMAL HIGH (ref 0.57–1.00)
Glucose: 100 mg/dL — ABNORMAL HIGH (ref 65–99)
Potassium: 4.3 mmol/L (ref 3.5–5.2)
Sodium: 144 mmol/L (ref 134–144)
eGFR: 36 mL/min/{1.73_m2} — ABNORMAL LOW (ref >59)

## 2020-11-09 LAB — HEPATIC FUNCTION PANEL
ALT: 7 IU/L (ref 0–32)
AST: 12 IU/L (ref 0–40)
Albumin: 4.2 g/dL (ref 3.6–4.6)
Alkaline Phosphatase: 61 IU/L (ref 44–121)
Bilirubin Total: 0.4 mg/dL (ref 0.0–1.2)
Bilirubin, Direct: 0.11 mg/dL (ref 0.00–0.40)
Total Protein: 6.6 g/dL (ref 6.0–8.5)

## 2020-11-09 LAB — TSH: TSH: 2.58 u[IU]/mL (ref 0.450–4.500)

## 2021-02-27 DIAGNOSIS — L821 Other seborrheic keratosis: Secondary | ICD-10-CM | POA: Diagnosis not present

## 2021-02-27 DIAGNOSIS — L578 Other skin changes due to chronic exposure to nonionizing radiation: Secondary | ICD-10-CM | POA: Diagnosis not present

## 2021-02-27 DIAGNOSIS — Z85820 Personal history of malignant melanoma of skin: Secondary | ICD-10-CM | POA: Diagnosis not present

## 2021-02-27 DIAGNOSIS — C44729 Squamous cell carcinoma of skin of left lower limb, including hip: Secondary | ICD-10-CM | POA: Diagnosis not present

## 2021-03-01 DIAGNOSIS — D0461 Carcinoma in situ of skin of right upper limb, including shoulder: Secondary | ICD-10-CM | POA: Diagnosis not present

## 2021-03-03 DIAGNOSIS — D045 Carcinoma in situ of skin of trunk: Secondary | ICD-10-CM | POA: Diagnosis not present

## 2021-03-03 DIAGNOSIS — C44729 Squamous cell carcinoma of skin of left lower limb, including hip: Secondary | ICD-10-CM | POA: Diagnosis not present

## 2021-03-06 DIAGNOSIS — E7800 Pure hypercholesterolemia, unspecified: Secondary | ICD-10-CM | POA: Diagnosis not present

## 2021-03-06 DIAGNOSIS — E1129 Type 2 diabetes mellitus with other diabetic kidney complication: Secondary | ICD-10-CM | POA: Diagnosis not present

## 2021-03-06 DIAGNOSIS — Z1331 Encounter for screening for depression: Secondary | ICD-10-CM | POA: Diagnosis not present

## 2021-03-06 DIAGNOSIS — I10 Essential (primary) hypertension: Secondary | ICD-10-CM | POA: Diagnosis not present

## 2021-03-06 DIAGNOSIS — D485 Neoplasm of uncertain behavior of skin: Secondary | ICD-10-CM | POA: Diagnosis not present

## 2021-03-06 DIAGNOSIS — D045 Carcinoma in situ of skin of trunk: Secondary | ICD-10-CM | POA: Diagnosis not present

## 2021-03-20 DIAGNOSIS — R634 Abnormal weight loss: Secondary | ICD-10-CM | POA: Diagnosis not present

## 2021-03-22 DIAGNOSIS — D045 Carcinoma in situ of skin of trunk: Secondary | ICD-10-CM | POA: Diagnosis not present

## 2021-03-31 ENCOUNTER — Other Ambulatory Visit: Payer: Self-pay | Admitting: Cardiology

## 2021-05-10 ENCOUNTER — Other Ambulatory Visit: Payer: Self-pay

## 2021-05-15 ENCOUNTER — Ambulatory Visit: Payer: Medicare Other | Admitting: Cardiology

## 2021-05-15 ENCOUNTER — Other Ambulatory Visit: Payer: Self-pay

## 2021-05-15 ENCOUNTER — Encounter: Payer: Self-pay | Admitting: Cardiology

## 2021-05-15 VITALS — BP 158/78 | HR 70 | Ht 63.0 in | Wt 150.4 lb

## 2021-05-15 DIAGNOSIS — I10 Essential (primary) hypertension: Secondary | ICD-10-CM

## 2021-05-15 DIAGNOSIS — E1122 Type 2 diabetes mellitus with diabetic chronic kidney disease: Secondary | ICD-10-CM | POA: Diagnosis not present

## 2021-05-15 DIAGNOSIS — N1831 Chronic kidney disease, stage 3a: Secondary | ICD-10-CM

## 2021-05-15 DIAGNOSIS — Z951 Presence of aortocoronary bypass graft: Secondary | ICD-10-CM | POA: Diagnosis not present

## 2021-05-15 DIAGNOSIS — I2510 Atherosclerotic heart disease of native coronary artery without angina pectoris: Secondary | ICD-10-CM | POA: Diagnosis not present

## 2021-05-15 NOTE — Progress Notes (Signed)
Cardiology Office Note:    Date:  05/15/2021   ID:  Alyssa Cooper, DOB 03/08/1941, MRN [id number]  PCP:  Alyssa Sheriff, MD  Cardiologist:  Alyssa Lindau, MD   Referring MD: Alyssa Sheriff, MD    ASSESSMENT:    1. Primary hypertension   2. Coronary artery disease involving native coronary artery of native heart without angina pectoris   3. Type 2 diabetes mellitus with stage 3a chronic kidney disease, without long-term current use of insulin ([place])   4. Hx of CABG    PLAN:    In order of problems listed above:  Coronary artery disease: Secondary prevention stressed with the patient.  Importance of compliance with diet medication stressed any vocalized understanding. Essential hypertension: Blood pressure stable and diet was emphasized.  I told her to keep a track of her blood pressures before and after taking medications in the morning and send [place] a copy of the vital signs.  We will review this and adjust her medications if necessary. Mixed dyslipidemia: Diet was emphasized.  Lipids done in the last time were reviewed and she will have all blood work today as she is fasting. Diabetes mellitus: Diet was emphasized.  Lifestyle modification urged. Patient will be seen in follow-up appointment in 6 months or earlier if the patient has any concerns    Medication Adjustments/Labs and Tests Ordered: Current medicines are reviewed at length with the patient today.  Concerns regarding medicines are outlined above.  Orders Placed This Encounter  Procedures   Basic metabolic panel   CBC with Differential/Platelet   Hepatic function panel   Lipid panel   TSH    No orders of the defined types were placed in this encounter.    No chief complaint on file.    History of Present Illness:    Alyssa Cooper is a 80 y.o. female.  Patient has past medical history of coronary artery disease, essential hypertension dyslipidemia and diabetes mellitus.  She denies any  problems at this time and takes care of activities of daily living.  She has lost weight.  She denies any other somatic symptoms.  She tells me that after the death of her husband and her son she has lost interest much and food.  She has talked to her primary care about this.  She mentions to me that after taking her morning medications she feels weak.  She has not taken her medication this morning.  At the time of my evaluation, the patient is alert awake oriented and in no distress.  Past Medical History:  Diagnosis Date   Abnormal nuclear cardiac imaging test 06/11/2019   Abnormal stress test 07/22/2019   Anemia, pernicious    CAD (coronary artery disease) 05/26/2019   CAD S/P percutaneous coronary angioplasty 07/23/2019   Chronic ischemic heart disease    Chronic kidney disease    Chronic kidney disease, stage 3 (moderate) 12/01/2018   Comprehensive diabetic foot examination, type 2 DM, encounter for ([place]) 12/23/2019   Diabetes mellitus due to underlying condition with unspecified complications ([place]) 123XX123   Diabetes mellitus without complication ([place]) 123XX123   Gammopathy, monoclonal 0000000   Helicobacter heilmannii gastritis    History of melanoma    Hx of CABG 05/26/2019   Hypertension 12/01/2018   Obesity    Osteoarthritis    Pain due to onychomycosis of toenail of right foot 01/13/2020   Peroneal tendonitis, left 12/23/2019   Preoperative cardiovascular examination 06/11/2019  Primary osteoarthritis of left foot 12/23/2019   Pure hypercholesterolemia    Renal osteodystrophy 12/01/2018   Type 2 diabetes mellitus with diabetic chronic kidney disease ([place]) 12/01/2018   Vitamin D deficiency     Past Surgical History:  Procedure Laterality Date   ABDOMINAL HYSTERECTOMY     BACK SURGERY  2011   BUNIONECTOMY     BYPASS GRAFT  2000   CATARACT EXTRACTION     CORONARY STENT INTERVENTION N/A 07/22/2019   Procedure: CORONARY STENT INTERVENTION;  Surgeon: Jettie Booze, MD;   Location: [facility];  Service: Cardiovascular;  Laterality: N/A;   LEFT HEART CATH AND CORS/GRAFTS ANGIOGRAPHY N/A 07/22/2019   Procedure: LEFT HEART CATH AND CORS/GRAFTS ANGIOGRAPHY;  Surgeon: Jettie Booze, MD;  Location: [facility];  Service: Cardiovascular;  Laterality: N/A;   PARTIAL HYSTERECTOMY     REPLACEMENT TOTAL KNEE      Current Medications: Current Meds  Medication Sig   amLODipine (NORVASC) 5 MG tablet Take 5 mg by mouth daily.   aspirin EC 81 MG tablet Take 81 mg by mouth daily.   carvedilol (COREG) 25 MG tablet Take 1 tablet by mouth 2 (two) times daily with a meal.    cloNIDine (CATAPRES) 0.1 MG tablet Take 0.1 mg by mouth at bedtime.   clopidogrel (PLAVIX) 75 MG tablet Take 75 mg by mouth daily.   ergocalciferol (VITAMIN D2) 1.25 MG (50000 UT) capsule Take 50,000 Units by mouth 2 (two) times a week.   LANTUS SOLOSTAR 100 UNIT/ML Solostar Pen Inject 10 Units into the skin at bedtime.   liraglutide (VICTOZA) 18 MG/3ML SOPN Inject 1.8 mg into the skin at bedtime.    metFORMIN (GLUCOPHAGE) 500 MG tablet Take 500 mg by mouth 2 (two) times daily.   nitroGLYCERIN (NITROSTAT) 0.4 MG SL tablet Place 0.4 mg under the tongue every 5 (five) minutes as needed for chest pain.   quinapril (ACCUPRIL) 40 MG tablet Take 40 mg by mouth daily.    rosuvastatin (CRESTOR) 5 MG tablet Take 5 mg by mouth 2 (two) times a week.     Allergies:   Penicillins, Atorvastatin, Ezetimibe, Motrin [ibuprofen], Nsaids, Pitavastatin, Tramadol, Bactrim [sulfamethoxazole-trimethoprim], and Furosemide   Social History   Socioeconomic History   Marital status: Widowed    Spouse name: Not on file   Number of children: 3   Years of education: Not on file   Highest education level: 10th grade  Occupational History   Not on file  Tobacco Use   Smoking status: Never   Smokeless tobacco: Never  Vaping Use   Vaping Use: Never used  Substance and Sexual Activity   Alcohol use: Never    Drug use: Never   Sexual activity: Not on file  Other Topics Concern   Not on file  Social History Narrative   Not on file   Social Determinants of Health   Financial Resource Strain: Not on file  Food Insecurity: Not on file  Transportation Needs: Not on file  Physical Activity: Not on file  Stress: Not on file  Social Connections: Not on file     Family History: The patient's family history includes Cancer in her son; Diabetes in her father and sister; Lung cancer in her brother.  ROS:   Please see the history of present illness.    All other systems reviewed and are negative.  EKGs/Labs/Other Studies Reviewed:    The following studies were reviewed today: I discussed my findings  with the patient at length.   Recent Labs: 10/20/2020: Hemoglobin 12.4; Platelets 153 11/08/2020: ALT 7; BUN 22; Creatinine, Ser 1.46; Potassium 4.3; Sodium 144; TSH 2.580  Recent Lipid Panel    Component Value Date/Time   CHOL 157 11/08/2020 0000   TRIG 109 11/08/2020 0000   HDL 39 (L) 11/08/2020 0000   CHOLHDL 4.0 11/08/2020 0000   CHOLHDL 4.5 07/23/2019 0934   VLDL 27 07/23/2019 0934   LDLCALC 98 11/08/2020 0000    Physical Exam:    VS:  BP (!) 158/78   Pulse 70   Ht '5\' 3"'$  (1.6 m)   Wt 150 lb 6.4 oz (68.2 kg)   SpO2 98%   BMI 26.64 kg/m     Wt Readings from Last 3 Encounters:  05/15/21 150 lb 6.4 oz (68.2 kg)  11/07/20 167 lb (75.8 kg)  10/20/20 168 lb 14.4 oz (76.6 kg)     GEN: Patient is in no acute distress HEENT: Normal NECK: No JVD; No carotid bruits LYMPHATICS: No lymphadenopathy CARDIAC: Hear sounds regular, 2/6 systolic murmur at the apex. RESPIRATORY:  Clear to auscultation without rales, wheezing or rhonchi  ABDOMEN: Soft, non-tender, non-distended MUSCULOSKELETAL:  No edema; No deformity  SKIN: Warm and dry NEUROLOGIC:  Alert and oriented x 3 PSYCHIATRIC:  Normal affect   Signed, Alyssa Lindau, MD  05/15/2021 8:57 AM    [place]

## 2021-05-15 NOTE — Patient Instructions (Addendum)
Medication Instructions:  Your physician recommends that you continue on your current medications as directed. Please refer to the Current Medication list given to you today.  *If you need a refill on your cardiac medications before your next appointment, please call your pharmacy*   Lab Work: Your physician recommends that you have labs done in the office today. Your test included  basic metabolic panel, complete blood count, TSH, liver function and lipids.  If you have labs (blood work) drawn today and your tests are completely normal, you will receive your results only by: [place] (if you have MyChart) OR A paper copy in the mail If you have any lab test that is abnormal or we need to change your treatment, we will call you to review the results.   Testing/Procedures: None ordered   Follow-Up: At [facility], you and your health needs are our priority.  As part of our continuing mission to provide you with exceptional heart care, we have created designated Provider Care Teams.  These Care Teams include your primary Cardiologist (physician) and Advanced Practice Providers (APPs -  Physician Assistants and Nurse Practitioners) who all work together to provide you with the care you need, when you need it.  We recommend signing up for the patient portal called "MyChart".  Sign up information is provided on this After Visit Summary.  MyChart is used to connect with patients for Virtual Visits (Telemedicine).  Patients are able to view lab/test results, encounter notes, upcoming appointments, etc.  Non-urgent messages can be sent to your provider as well.   To learn more about what you can do with MyChart, go to [email or web address].    Your next appointment:   6 month(s)  The format for your next appointment:   In Person  Provider:   Jyl Heinz, MD   Other Instructions  Blood Pressure Record Sheet To take your blood pressure, you will need a blood pressure  machine. You can buy a blood pressure machine (blood pressure monitor) at your clinic, drug store, or online. When choosing one, consider: An automatic monitor that has an arm cuff. A cuff that wraps snugly around your upper arm. You should be able to fit only one finger between your arm and the cuff. A device that stores blood pressure reading results. Do not choose a monitor that measures your blood pressure from your wrist or finger. Follow your health care provider's instructions for how to take your blood pressure. To use this form: Get one reading in the morning (a.m.) 1-2 hours after you take any medicines. Get one reading in the evening (p.m.) before supper. Take at least 2 readings with each blood pressure check. This makes sure the results are correct. Wait 1-2 minutes between measurements. Write down the results in the spaces on this form. Repeat this once a week, or as told by your health care provider.  Make a follow-up appointment with your health care provider to discuss the results. Blood pressure log Date: _______________________ a.m. _____________________(1st reading) HR___________            p.m. _____________________(2nd reading) HR__________  Date: _______________________ a.m. _____________________(1st reading) HR___________            p.m. _____________________(2nd reading) HR__________ Date: _______________________ a.m. _____________________(1st reading) HR___________            p.m. _____________________(2nd reading) HR__________ Date: _______________________ a.m. _____________________(1st reading) HR___________            p.m. _____________________(2nd reading) HR__________  Date: _______________________  a.m. _____________________(1st reading) HR___________            p.m. _____________________(2nd reading) HR__________  Date: _______________________ a.m. _____________________(1st reading) HR___________            p.m. _____________________(2nd  reading) HR__________  Date: _______________________ a.m. _____________________(1st reading) HR___________            p.m. _____________________(2nd reading) HR__________   This information is not intended to replace advice given to you by your health care provider. Make sure you discuss any questions you have with your health care provider. Document Revised: 12/15/2019 Document Reviewed: 12/15/2019 Elsevier Patient Education  2021 [employer].

## 2021-05-16 LAB — CBC WITH DIFFERENTIAL/PLATELET
Basophils Absolute: 0.1 10*3/uL (ref 0.0–0.2)
Basos: 1 %
EOS (ABSOLUTE): 0.2 10*3/uL (ref 0.0–0.4)
Eos: 3 %
Hematocrit: 36.9 % (ref 34.0–46.6)
Hemoglobin: 12.1 g/dL (ref 11.1–15.9)
Immature Grans (Abs): 0 10*3/uL (ref 0.0–0.1)
Immature Granulocytes: 0 %
Lymphocytes Absolute: 1.6 10*3/uL (ref 0.7–3.1)
Lymphs: 21 %
MCH: 30.4 pg (ref 26.6–33.0)
MCHC: 32.8 g/dL (ref 31.5–35.7)
MCV: 93 fL (ref 79–97)
Monocytes Absolute: 0.4 10*3/uL (ref 0.1–0.9)
Monocytes: 5 %
Neutrophils Absolute: 5.3 10*3/uL (ref 1.4–7.0)
Neutrophils: 70 %
Platelets: 155 10*3/uL (ref 150–450)
RBC: 3.98 x10E6/uL (ref 3.77–5.28)
RDW: 13.3 % (ref 11.7–15.4)
WBC: 7.5 10*3/uL (ref 3.4–10.8)

## 2021-05-16 LAB — LIPID PANEL
Chol/HDL Ratio: 3.2 ratio (ref 0.0–4.4)
Cholesterol, Total: 149 mg/dL (ref 100–199)
HDL: 46 mg/dL (ref >39)
LDL Chol Calc (NIH): 84 mg/dL (ref 0–99)
Triglycerides: 105 mg/dL (ref 0–149)
VLDL Cholesterol Cal: 19 mg/dL (ref 5–40)

## 2021-05-16 LAB — BASIC METABOLIC PANEL
BUN/Creatinine Ratio: 19 (ref 12–28)
BUN: 39 mg/dL — ABNORMAL HIGH (ref 8–27)
CO2: 19 mmol/L — ABNORMAL LOW (ref 20–29)
Calcium: 10.5 mg/dL — ABNORMAL HIGH (ref 8.7–10.3)
Chloride: 109 mmol/L — ABNORMAL HIGH (ref 96–106)
Creatinine, Ser: 2.06 mg/dL — ABNORMAL HIGH (ref 0.57–1.00)
Glucose: 101 mg/dL — ABNORMAL HIGH (ref 65–99)
Potassium: 4.2 mmol/L (ref 3.5–5.2)
Sodium: 144 mmol/L (ref 134–144)
eGFR: 24 mL/min/{1.73_m2} — ABNORMAL LOW (ref >59)

## 2021-05-16 LAB — HEPATIC FUNCTION PANEL
ALT: 7 IU/L (ref 0–32)
AST: 12 IU/L (ref 0–40)
Albumin: 4.5 g/dL (ref 3.6–4.6)
Alkaline Phosphatase: 61 IU/L (ref 44–121)
Bilirubin Total: 0.3 mg/dL (ref 0.0–1.2)
Bilirubin, Direct: 0.12 mg/dL (ref 0.00–0.40)
Total Protein: 6.4 g/dL (ref 6.0–8.5)

## 2021-05-16 LAB — TSH: TSH: 2.79 u[IU]/mL (ref 0.450–4.500)

## 2021-05-17 DIAGNOSIS — I10 Essential (primary) hypertension: Secondary | ICD-10-CM | POA: Diagnosis not present

## 2021-05-17 DIAGNOSIS — Z6826 Body mass index (BMI) 26.0-26.9, adult: Secondary | ICD-10-CM | POA: Diagnosis not present

## 2021-05-17 DIAGNOSIS — E1129 Type 2 diabetes mellitus with other diabetic kidney complication: Secondary | ICD-10-CM | POA: Diagnosis not present

## 2021-05-17 DIAGNOSIS — R42 Dizziness and giddiness: Secondary | ICD-10-CM | POA: Diagnosis not present

## 2021-06-07 DIAGNOSIS — C44729 Squamous cell carcinoma of skin of left lower limb, including hip: Secondary | ICD-10-CM | POA: Diagnosis not present

## 2021-06-07 DIAGNOSIS — D045 Carcinoma in situ of skin of trunk: Secondary | ICD-10-CM | POA: Diagnosis not present

## 2021-06-07 DIAGNOSIS — D0461 Carcinoma in situ of skin of right upper limb, including shoulder: Secondary | ICD-10-CM | POA: Diagnosis not present

## 2021-06-07 DIAGNOSIS — L570 Actinic keratosis: Secondary | ICD-10-CM | POA: Diagnosis not present

## 2021-07-05 DIAGNOSIS — E119 Type 2 diabetes mellitus without complications: Secondary | ICD-10-CM | POA: Diagnosis not present

## 2021-07-05 DIAGNOSIS — I10 Essential (primary) hypertension: Secondary | ICD-10-CM | POA: Diagnosis not present

## 2021-07-05 DIAGNOSIS — E113391 Type 2 diabetes mellitus with moderate nonproliferative diabetic retinopathy without macular edema, right eye: Secondary | ICD-10-CM | POA: Diagnosis not present

## 2021-07-05 DIAGNOSIS — H524 Presbyopia: Secondary | ICD-10-CM | POA: Diagnosis not present

## 2021-08-08 DIAGNOSIS — E785 Hyperlipidemia, unspecified: Secondary | ICD-10-CM | POA: Diagnosis not present

## 2021-08-08 DIAGNOSIS — I10 Essential (primary) hypertension: Secondary | ICD-10-CM | POA: Diagnosis not present

## 2021-08-09 DIAGNOSIS — E113211 Type 2 diabetes mellitus with mild nonproliferative diabetic retinopathy with macular edema, right eye: Secondary | ICD-10-CM | POA: Diagnosis not present

## 2021-08-09 DIAGNOSIS — E113292 Type 2 diabetes mellitus with mild nonproliferative diabetic retinopathy without macular edema, left eye: Secondary | ICD-10-CM | POA: Diagnosis not present

## 2021-09-07 DIAGNOSIS — E785 Hyperlipidemia, unspecified: Secondary | ICD-10-CM | POA: Diagnosis not present

## 2021-09-07 DIAGNOSIS — I10 Essential (primary) hypertension: Secondary | ICD-10-CM | POA: Diagnosis not present

## 2021-09-28 DIAGNOSIS — Z6826 Body mass index (BMI) 26.0-26.9, adult: Secondary | ICD-10-CM | POA: Diagnosis not present

## 2021-09-28 DIAGNOSIS — M542 Cervicalgia: Secondary | ICD-10-CM | POA: Diagnosis not present

## 2021-09-28 DIAGNOSIS — G8929 Other chronic pain: Secondary | ICD-10-CM | POA: Diagnosis not present

## 2021-09-28 DIAGNOSIS — I10 Essential (primary) hypertension: Secondary | ICD-10-CM | POA: Diagnosis not present

## 2021-10-16 NOTE — Progress Notes (Incomplete)
[place]  [address] [place],  [place]  [address] [phone]  Clinic Day:  10/16/2021  Referring physician: Angelina Sheriff, MD  This document serves as a record of services personally performed by Hosie Poisson, MD. It was created on their behalf by [facility] E, a trained medical scribe. The creation of this record is based on the scribe's personal observations and the provider's statements to them.  CHIEF COMPLAINT:  CC: Benign monoclonal gammopathy   Current Treatment:  Surveillance   HISTORY OF PRESENT ILLNESS:  Alyssa Cooper is a 81 y.o. female with a history of a tiny monoclonal spike of IgG lambda.  Her quantitative immunoglobulins were normal and a urine protein electrophoresis was negative.  She does have chronic kidney disease and her creatinine was 1.3 at that time.  The monoclonal gammopathy was detected by her nephrologist.  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  We recommended that we simply monitor this.  When she was seen in 2019, the M spike was still quite low at 0.2 g per dL, and her quantitative immunoglobulins were normal.  Her husband expired on June 28, 2018.  Repeat labs in February of 2020 revealed a stable M-spike of 0.2.    INTERVAL HISTORY:  Alyssa Cooper is here today for routine follow up of her benign monoclonal gammopathy. She has been well since her last visit. She did lose her son last August to cancer and has had some anxiety and depression surrounding this event, but says she is managing. She has a good support system and does not require medications. She denies fever, chills, nausea or vomiting. She is completely vaccinated and denies shortness of breath, chest pain or cough. She continues to be very active. She denies issue with bowel or bladder. Her appetite is good and her weight has decreased since last visit; however, she has been trying to eat healthier and lose weight to better manage her  diabetes. CBC and CMP are unremarkable today other than an elevated creatinine at 1.6 which is stable since last visit.   Kemi is here for routine follow up ***.   Her  appetite is good, and she has gained/lost _ pounds since her last visit.  She denies fever, chills or other signs of infection.  She denies nausea, vomiting, bowel issues, or abdominal pain.  She denies sore throat, cough, dyspnea, or chest pain.  REVIEW OF SYSTEMS:  Review of Systems  Constitutional:  Negative for appetite change, chills, diaphoresis, fatigue, fever and unexpected weight change.  HENT:   Negative for hearing loss, lump/mass, mouth sores, nosebleeds, sore throat, tinnitus, trouble swallowing and voice change.   Eyes:  Negative for eye problems and icterus.  Respiratory:  Negative for chest tightness, cough, hemoptysis, shortness of breath and wheezing.   Cardiovascular:  Negative for chest pain, leg swelling and palpitations.  Gastrointestinal:  Negative for abdominal distention, abdominal pain, blood in stool, constipation, diarrhea, nausea, rectal pain and vomiting.  Endocrine: Negative for hot flashes.  Genitourinary:  Negative for bladder incontinence, difficulty urinating, dyspareunia, dysuria, frequency, hematuria and nocturia.   Musculoskeletal:  Negative for arthralgias, back pain, flank pain, gait problem, myalgias, neck pain and neck stiffness.  Skin:  Negative for itching, rash and wound.  Neurological:  Negative for dizziness, extremity weakness, gait problem, headaches, light-headedness, numbness, seizures and speech difficulty.  Hematological:  Negative for adenopathy. Does not bruise/bleed easily.  Psychiatric/Behavioral:  Negative for confusion, decreased concentration, depression,  sleep disturbance and suicidal ideas. The patient is not nervous/anxious.     VITALS:  There were no vitals taken for this visit.  Wt Readings from Last 3 Encounters:  05/15/21 150 lb 6.4 oz (68.2 kg)  11/07/20 167  lb (75.8 kg)  10/20/20 168 lb 14.4 oz (76.6 kg)    There is no height or weight on file to calculate BMI.  Performance status (ECOG): 0 - Asymptomatic  PHYSICAL EXAM:  Physical Exam Constitutional:      General: She is not in acute distress.    Appearance: Normal appearance. She is normal weight. She is not ill-appearing, toxic-appearing or diaphoretic.  HENT:     Head: Normocephalic and atraumatic.     Nose: Nose normal. No congestion or rhinorrhea.     Mouth/Throat:     Mouth: Mucous membranes are moist.     Pharynx: Oropharynx is clear. No oropharyngeal exudate or posterior oropharyngeal erythema.  Eyes:     General: No scleral icterus.       Right eye: No discharge.        Left eye: No discharge.     Extraocular Movements: Extraocular movements intact.     Conjunctiva/sclera: Conjunctivae normal.     Pupils: Pupils are equal, round, and reactive to light.  Neck:     Vascular: No carotid bruit.  Cardiovascular:     Rate and Rhythm: Normal rate and regular rhythm.     Heart sounds: No murmur heard.   No friction rub. No gallop.  Pulmonary:     Effort: Pulmonary effort is normal. No respiratory distress.     Breath sounds: Normal breath sounds. No stridor. No wheezing, rhonchi or rales.  Chest:     Chest wall: No tenderness.  Abdominal:     General: Abdomen is flat. Bowel sounds are normal. There is no distension.     Palpations: There is no mass.     Tenderness: There is no abdominal tenderness. There is no right CVA tenderness, left CVA tenderness, guarding or rebound.     Hernia: No hernia is present.  Musculoskeletal:        General: No swelling, tenderness, deformity or signs of injury. Normal range of motion.     Cervical back: Normal range of motion and neck supple. No rigidity or tenderness.     Right lower leg: No edema.     Left lower leg: No edema.  Lymphadenopathy:     Cervical: No cervical adenopathy.  Skin:    General: Skin is warm and dry.      Capillary Refill: Capillary refill takes less than 2 seconds.     Coloration: Skin is not jaundiced or pale.     Findings: No bruising, erythema, lesion or rash.  Neurological:     General: No focal deficit present.     Mental Status: She is alert and oriented to person, place, and time. Mental status is at baseline.     Cranial Nerves: No cranial nerve deficit.     Sensory: No sensory deficit.     Motor: No weakness.     Coordination: Coordination normal.     Gait: Gait normal.     Deep Tendon Reflexes: Reflexes normal.  Psychiatric:        Mood and Affect: Mood normal.        Behavior: Behavior normal.        Thought Content: Thought content normal.        Judgment: Judgment normal.  LABS:   CBC Latest Ref Rng & Units 05/15/2021 10/20/2020 05/11/2020  WBC 3.4 - 10.8 x10E3/uL 7.5 9.1 7.1  Hemoglobin 11.1 - 15.9 g/dL 12.1 12.4 11.6  Hematocrit 34.0 - 46.6 % 36.9 37 36.0  Platelets 150 - 450 x10E3/uL 155 153 141(L)   CMP Latest Ref Rng & Units 05/15/2021 11/08/2020 10/20/2020  Glucose 65 - 99 mg/dL 101(H) 100(H) -  BUN 8 - 27 mg/dL 39(H) 22 26(A)  Creatinine 0.57 - 1.00 mg/dL 2.06(H) 1.46(H) 1.6(A)  Sodium 134 - 144 mmol/L 144 144 141  Potassium 3.5 - 5.2 mmol/L 4.2 4.3 4.5  Chloride 96 - 106 mmol/L 109(H) 107(H) 107  CO2 20 - 29 mmol/L 19(L) 23 26(A)  Calcium 8.7 - 10.3 mg/dL 10.5(H) 10.2 10.0  Total Protein 6.0 - 8.5 g/dL 6.4 6.6 -  Total Bilirubin 0.0 - 1.2 mg/dL 0.3 0.4 -  Alkaline Phos 44 - 121 IU/L 61 61 57  AST 0 - 40 IU/L 12 12 22   ALT 0 - 32 IU/L 7 7 13     Lab Results  Component Value Date   TOTALPROTELP 6.2 10/20/2020   ALBUMINELP 3.7 10/20/2020   A1GS 0.1 10/20/2020   A2GS 0.8 10/20/2020   BETS 0.9 10/20/2020   GAMS 0.7 10/20/2020   MSPIKE Not Observed 10/20/2020   SPEI Comment 10/20/2020   No results found for: TIBC, FERRITIN, IRONPCTSAT No results found for: LDH  STUDIES:  No results found.    HISTORY:   Allergies:  Allergies  Allergen Reactions    Penicillins Hives    Did it involve swelling of the face/tongue/throat, SOB, or low BP? No Did it involve sudden or severe rash/hives, skin peeling, or any reaction on the inside of your mouth or nose? Yes Did you need to seek medical attention at a hospital or doctor's office? Yes When did it last happen?     in her 88s?  If all above answers are NO, may proceed with cephalosporin use.    Atorvastatin Other (See Comments)    Joint pain   Ezetimibe Other (See Comments)    Muscle aches     Motrin [Ibuprofen]    Nsaids    Pitavastatin Other (See Comments)   Tramadol Other (See Comments)   Bactrim [Sulfamethoxazole-Trimethoprim] Rash   Furosemide Rash    Current Medications: Current Outpatient Medications  Medication Sig Dispense Refill   amLODipine (NORVASC) 5 MG tablet Take 5 mg by mouth daily.     aspirin EC 81 MG tablet Take 81 mg by mouth daily.     carvedilol (COREG) 25 MG tablet Take 1 tablet by mouth 2 (two) times daily with a meal.      cloNIDine (CATAPRES) 0.1 MG tablet Take 0.1 mg by mouth at bedtime.     clopidogrel (PLAVIX) 75 MG tablet Take 75 mg by mouth daily.     ergocalciferol (VITAMIN D2) 1.25 MG (50000 UT) capsule Take 50,000 Units by mouth 2 (two) times a week.     LANTUS SOLOSTAR 100 UNIT/ML Solostar Pen Inject 10 Units into the skin at bedtime.     liraglutide (VICTOZA) 18 MG/3ML SOPN Inject 1.8 mg into the skin at bedtime.      metFORMIN (GLUCOPHAGE) 500 MG tablet Take 500 mg by mouth 2 (two) times daily.     nitroGLYCERIN (NITROSTAT) 0.4 MG SL tablet Place 0.4 mg under the tongue every 5 (five) minutes as needed for chest pain.     quinapril (ACCUPRIL) 40 MG tablet Take  40 mg by mouth daily.      rosuvastatin (CRESTOR) 5 MG tablet Take 5 mg by mouth 2 (two) times a week.     No current facility-administered medications for this visit.     ASSESSMENT & PLAN:   Assessment:  Alyssa Cooper is a 81 y.o. female with benign monoclonal gammopathy. She  continues to do well. She will return to clinic in one year for repeat CBC, CMP, serum protein electrophoresis, quantitative immunoglobulins and evaluation. The patient understands the plans discussed today and is in agreement with them.  She knows to contact our office if she develops concerns prior to her next appointment.  I provided *** minutes of face-to-face time during this this encounter and > 50% was spent counseling as documented under my assessment and plan.    I, Rita Ohara, am acting as scribe for Derwood Kaplan, MD  I have reviewed this report as typed by the medical scribe, and it is complete and accurate.

## 2021-10-22 ENCOUNTER — Inpatient Hospital Stay: Payer: Medicare Other | Admitting: Oncology

## 2021-10-22 ENCOUNTER — Other Ambulatory Visit: Payer: Medicare Other

## 2021-10-22 ENCOUNTER — Telehealth: Payer: Self-pay | Admitting: Oncology

## 2021-10-22 ENCOUNTER — Other Ambulatory Visit: Payer: Self-pay | Admitting: Oncology

## 2021-10-22 DIAGNOSIS — D472 Monoclonal gammopathy: Secondary | ICD-10-CM

## 2021-10-22 NOTE — Telephone Encounter (Signed)
Patient rescheduled Missed Appt from today to 3/8 Labs 2:00 pm - Follow Up 2:30 pm

## 2021-11-06 DIAGNOSIS — E895 Postprocedural testicular hypofunction: Secondary | ICD-10-CM | POA: Diagnosis not present

## 2021-11-06 DIAGNOSIS — I10 Essential (primary) hypertension: Secondary | ICD-10-CM | POA: Diagnosis not present

## 2021-11-08 NOTE — Progress Notes (Signed)
[place]  [address] [place],  [place]  [address] [phone]  Clinic Day:  11/14/2021  Referring physician: Angelina Sheriff, MD  This document serves as a record of services personally performed by Hosie Poisson, MD. It was created on their behalf by [facility] E, a trained medical scribe. The creation of this record is based on the scribe's personal observations and the provider's statements to them.  CHIEF COMPLAINT:  CC: Benign monoclonal gammopathy   Current Treatment:  Surveillance   HISTORY OF PRESENT ILLNESS:  Alyssa Cooper is a 81 y.o. female with a history of a tiny monoclonal spike of IgG lambda.  Her quantitative immunoglobulins were normal and a urine protein electrophoresis was negative.  She does have chronic kidney disease and her creatinine was 1.3 at that time.  The monoclonal gammopathy was detected by her nephrologist.  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  We recommended that we simply monitor this.  When she was seen in 2019, the M spike was still quite low at 0.2 g per dL, and her quantitative immunoglobulins were normal.  Her husband expired on June 28, 2018.  Repeat labs in February of 2020 revealed a stable M-spike of 0.2.  In February of 2022, the M spike was undetectable but in July of 2022, it was 0.1.Marland Kitchen  INTERVAL HISTORY:  [name] is here for routine follow up and states that she is doing fairly well. She states that she has been taken off of her diabetic medication. She denies fatigue and states that she remains active. She does have occasional pain of the right leg after yard work. Her creatinine was up to 2.1 at Dr. Janace Aris office in September and is now up to 2.7 with an EGFR of 17. Hemoglobin has decreased from 12.4 to 11.1, platelets have decreased from 153,000 to 126,000, and white count is normal. Her  appetite is poor, and she has lost 4 and 1/2 pounds since her last visit.  She denies fever,  chills or other signs of infection.  She denies nausea, vomiting, bowel issues, or abdominal pain.  She denies sore throat, cough, dyspnea, or chest pain.   REVIEW OF SYSTEMS:  Review of Systems  Constitutional: Negative.  Negative for appetite change, chills, fatigue, fever and unexpected weight change.  HENT:  Negative.    Eyes: Negative.   Respiratory: Negative.  Negative for chest tightness, cough, hemoptysis, shortness of breath and wheezing.   Cardiovascular: Negative.  Negative for chest pain, leg swelling and palpitations.  Gastrointestinal: Negative.  Negative for abdominal distention, abdominal pain, blood in stool, constipation, diarrhea, nausea and vomiting.  Endocrine: Negative.   Genitourinary: Negative.  Negative for difficulty urinating, dysuria, frequency and hematuria.   Musculoskeletal:  Positive for arthralgias (especially of the bilateral shoulders). Negative for back pain, flank pain, gait problem and myalgias.  Skin: Negative.   Neurological: Negative.  Negative for dizziness, extremity weakness, gait problem, headaches, light-headedness, numbness, seizures and speech difficulty.  Hematological: Negative.   Psychiatric/Behavioral: Negative.  Negative for depression and sleep disturbance. The patient is not nervous/anxious.     VITALS:  There were no vitals taken for this visit.  Wt Readings from Last 3 Encounters:  05/15/21 150 lb 6.4 oz (68.2 kg)  11/07/20 167 lb (75.8 kg)  10/20/20 168 lb 14.4 oz (76.6 kg)    There is no height or weight on file to calculate BMI.  Performance status (ECOG): 0 - Asymptomatic  PHYSICAL EXAM:  Physical Exam Constitutional:      General: She is not in acute distress.    Appearance: Normal appearance. She is normal weight.  HENT:     Head: Normocephalic and atraumatic.  Eyes:     General: No scleral icterus.    Extraocular Movements: Extraocular movements intact.     Conjunctiva/sclera: Conjunctivae normal.     Pupils:  Pupils are equal, round, and reactive to light.  Cardiovascular:     Rate and Rhythm: Normal rate and regular rhythm.     Pulses: Normal pulses.     Heart sounds: Normal heart sounds. No murmur heard.   No friction rub. No gallop.  Pulmonary:     Effort: Pulmonary effort is normal. No respiratory distress.     Breath sounds: Normal breath sounds.  Abdominal:     General: Bowel sounds are normal. There is no distension.     Palpations: Abdomen is soft. There is no hepatomegaly, splenomegaly or mass.     Tenderness: There is no abdominal tenderness.  Musculoskeletal:        General: Normal range of motion.     Cervical back: Normal range of motion and neck supple.     Right lower leg: No edema.     Left lower leg: No edema.     Comments: Right shoulder is prominent  Lymphadenopathy:     Cervical: No cervical adenopathy.  Skin:    General: Skin is warm and dry.  Neurological:     General: No focal deficit present.     Mental Status: She is alert and oriented to person, place, and time. Mental status is at baseline.  Psychiatric:        Mood and Affect: Mood normal.        Behavior: Behavior normal.        Thought Content: Thought content normal.        Judgment: Judgment normal.    LABS:   CBC Latest Ref Rng & Units 11/14/2021 05/15/2021 10/20/2020  WBC - 7.0 7.5 9.1  Hemoglobin 12.0 - 16.0 11.1(A) 12.1 12.4  Hematocrit 36 - 46 34(A) 36.9 37  Platelets 150 - 400 K/uL 126(A) 155 153   CMP Latest Ref Rng & Units 11/14/2021 05/15/2021 11/08/2020  Glucose 65 - 99 mg/dL - 101(H) 100(H)  BUN 4 - 21 40(A) 39(H) 22  Creatinine 0.5 - 1.1 2.7(A) 2.06(H) 1.46(H)  Sodium 137 - 147 141 144 144  Potassium 3.5 - 5.1 mEq/L 4.0 4.2 4.3  Chloride 99 - 108 108 109(H) 107(H)  CO2 13 - 22 27(A) 19(L) 23  Calcium 8.7 - 10.7 10.0 10.5(H) 10.2  Total Protein 6.0 - 8.5 g/dL - 6.4 6.6  Total Bilirubin 0.0 - 1.2 mg/dL - 0.3 0.4  Alkaline Phos 25 - 125 53 61 61  AST 13 - 35 33 12 12  ALT 7 - 35 U/L 21 7  7     Lab Results  Component Value Date   TOTALPROTELP 6.2 10/20/2020   ALBUMINELP 3.7 10/20/2020   A1GS 0.1 10/20/2020   A2GS 0.8 10/20/2020   BETS 0.9 10/20/2020   GAMS 0.7 10/20/2020   MSPIKE Not Observed 10/20/2020   SPEI Comment 10/20/2020    STUDIES:  No results found.    HISTORY:   Allergies:  Allergies  Allergen Reactions   Penicillins Hives    Did it involve swelling of the face/tongue/throat, SOB, or low BP? No Did it involve sudden or severe rash/hives, skin  peeling, or any reaction on the inside of your mouth or nose? Yes Did you need to seek medical attention at a hospital or doctor's office? Yes When did it last happen?     in her 69s?  If all above answers are NO, may proceed with cephalosporin use.    Atorvastatin Other (See Comments)    Joint pain   Ezetimibe Other (See Comments)    Muscle aches     Motrin [Ibuprofen]    Nsaids    Pitavastatin Other (See Comments)   Tramadol Other (See Comments)   Bactrim [Sulfamethoxazole-Trimethoprim] Rash   Furosemide Rash    Current Medications: Current Outpatient Medications  Medication Sig Dispense Refill   amLODipine (NORVASC) 5 MG tablet Take 5 mg by mouth daily.     aspirin EC 81 MG tablet Take 81 mg by mouth daily.     carvedilol (COREG) 25 MG tablet Take 1 tablet by mouth 2 (two) times daily with a meal.      cloNIDine (CATAPRES) 0.1 MG tablet Take 0.1 mg by mouth at bedtime.     clopidogrel (PLAVIX) 75 MG tablet Take 75 mg by mouth daily.     ergocalciferol (VITAMIN D2) 1.25 MG (50000 UT) capsule Take 50,000 Units by mouth 2 (two) times a week.     LANTUS SOLOSTAR 100 UNIT/ML Solostar Pen Inject 10 Units into the skin at bedtime.     liraglutide (VICTOZA) 18 MG/3ML SOPN Inject 1.8 mg into the skin at bedtime.      lisinopril (ZESTRIL) 20 MG tablet Take 20 mg by mouth daily.     metFORMIN (GLUCOPHAGE) 500 MG tablet Take 500 mg by mouth 2 (two) times daily.     nitroGLYCERIN (NITROSTAT) 0.4 MG SL  tablet Place 0.4 mg under the tongue every 5 (five) minutes as needed for chest pain.     quinapril (ACCUPRIL) 40 MG tablet Take 40 mg by mouth daily.      rosuvastatin (CRESTOR) 5 MG tablet Take 5 mg by mouth 2 (two) times a week.     No current facility-administered medications for this visit.     ASSESSMENT & PLAN:   Assessment/Plan:   Benign monoclonal gammopathy, last measuring 0.1 in July 2022. Today's results are pending.  Anemia, new. We will evaluate for nutritional deficiency with iron studies, B12 and folate.   Chronic kidney disease, worsening. This may be contributing to her worsening anemia. She is being referred to nephrology.   I will contact her with the results of her other labs. We will see her back in one year for repeat CBC, CMP, serum protein electrophoresis, quantitative immunoglobulins, serum light chains and evaluation. The patient understands the plans discussed today and is in agreement with them.  She knows to contact our office if she develops concerns prior to her next appointment.  I provided 20 minutes of face-to-face time during this this encounter and > 50% was spent counseling as documented under my assessment and plan.    I, Rita Ohara, am acting as scribe for Derwood Kaplan, MD  I have reviewed this report as typed by the medical scribe, and it is complete and accurate.

## 2021-11-14 ENCOUNTER — Inpatient Hospital Stay: Payer: Medicare Other | Admitting: Oncology

## 2021-11-14 ENCOUNTER — Other Ambulatory Visit: Payer: Self-pay

## 2021-11-14 ENCOUNTER — Inpatient Hospital Stay: Payer: Medicare Other | Attending: Oncology

## 2021-11-14 ENCOUNTER — Other Ambulatory Visit: Payer: Self-pay | Admitting: Oncology

## 2021-11-14 ENCOUNTER — Encounter: Payer: Self-pay | Admitting: Oncology

## 2021-11-14 VITALS — BP 186/89 | HR 67 | Temp 97.8°F | Resp 14 | Ht 63.0 in | Wt 146.1 lb

## 2021-11-14 DIAGNOSIS — D510 Vitamin B12 deficiency anemia due to intrinsic factor deficiency: Secondary | ICD-10-CM

## 2021-11-14 DIAGNOSIS — D649 Anemia, unspecified: Secondary | ICD-10-CM | POA: Insufficient documentation

## 2021-11-14 DIAGNOSIS — D472 Monoclonal gammopathy: Secondary | ICD-10-CM | POA: Diagnosis not present

## 2021-11-14 DIAGNOSIS — N189 Chronic kidney disease, unspecified: Secondary | ICD-10-CM | POA: Insufficient documentation

## 2021-11-14 LAB — IRON AND TIBC
Iron: 76 ug/dL (ref 28–170)
Saturation Ratios: 25 % (ref 10.4–31.8)
TIBC: 308 ug/dL (ref 250–450)
UIBC: 232 ug/dL

## 2021-11-14 LAB — CBC: RBC: 3.76 — AB (ref 3.87–5.11)

## 2021-11-14 LAB — FERRITIN: Ferritin: 70 ng/mL (ref 11–307)

## 2021-11-14 LAB — HEPATIC FUNCTION PANEL
ALT: 21 U/L (ref 7–35)
AST: 33 (ref 13–35)
Alkaline Phosphatase: 53 (ref 25–125)
Bilirubin, Total: 0.5

## 2021-11-14 LAB — CBC AND DIFFERENTIAL
HCT: 34 — AB (ref 36–46)
Hemoglobin: 11.1 — AB (ref 12.0–16.0)
Neutrophils Absolute: 4.34
Platelets: 126 10*3/uL — AB (ref 150–400)
WBC: 7

## 2021-11-14 LAB — BASIC METABOLIC PANEL
BUN: 40 — AB (ref 4–21)
CO2: 27 — AB (ref 13–22)
Chloride: 108 (ref 99–108)
Creatinine: 2.7 — AB (ref 0.5–1.1)
Glucose: 167
Potassium: 4 mEq/L (ref 3.5–5.1)
Sodium: 141 (ref 137–147)

## 2021-11-14 LAB — COMPREHENSIVE METABOLIC PANEL
Albumin: 4.1 (ref 3.5–5.0)
Calcium: 10 (ref 8.7–10.7)

## 2021-11-14 LAB — FOLATE: Folate: 11.2 ng/mL (ref >5.9)

## 2021-11-14 LAB — VITAMIN B12: Vitamin B-12: 265 pg/mL (ref 180–914)

## 2021-11-15 LAB — IGG, IGA, IGM
IgA: 138 mg/dL (ref 64–422)
IgG (Immunoglobin G), Serum: 821 mg/dL (ref 586–1602)
IgM (Immunoglobulin M), Srm: 45 mg/dL (ref 26–217)

## 2021-11-16 ENCOUNTER — Ambulatory Visit: Payer: Medicare Other | Admitting: Cardiology

## 2021-11-16 ENCOUNTER — Other Ambulatory Visit: Payer: Self-pay

## 2021-11-16 ENCOUNTER — Encounter: Payer: Self-pay | Admitting: Cardiology

## 2021-11-16 VITALS — BP 136/80 | HR 68 | Ht 63.0 in | Wt 144.6 lb

## 2021-11-16 DIAGNOSIS — I350 Nonrheumatic aortic (valve) stenosis: Secondary | ICD-10-CM

## 2021-11-16 DIAGNOSIS — Z951 Presence of aortocoronary bypass graft: Secondary | ICD-10-CM | POA: Diagnosis not present

## 2021-11-16 DIAGNOSIS — E7800 Pure hypercholesterolemia, unspecified: Secondary | ICD-10-CM | POA: Diagnosis not present

## 2021-11-16 DIAGNOSIS — I2510 Atherosclerotic heart disease of native coronary artery without angina pectoris: Secondary | ICD-10-CM | POA: Diagnosis not present

## 2021-11-16 DIAGNOSIS — E088 Diabetes mellitus due to underlying condition with unspecified complications: Secondary | ICD-10-CM | POA: Diagnosis not present

## 2021-11-16 HISTORY — DX: Nonrheumatic aortic (valve) stenosis: I35.0

## 2021-11-16 NOTE — Progress Notes (Signed)
?Cardiology Office Note:   ? ?Date:  11/16/2021  ? ?ID:  Alyssa Cooper, DOB Apr 01, 1941, MRN [id number] ? ?PCP:  Angelina Sheriff, MD  ?Cardiologist:  Jenean Lindau, MD  ? ?Referring MD: Angelina Sheriff, MD  ? ? ?ASSESSMENT:   ? ?1. Coronary artery disease involving native coronary artery of native heart without angina pectoris   ?2. Pure hypercholesterolemia   ?3. Diabetes mellitus due to underlying condition with unspecified complications ([place])   ?4. Hx of CABG   ?5. Moderate aortic stenosis   ? ?PLAN:   ? ?In order of problems listed above: ? ?Coronary artery disease: Secondary prevention stressed with the patient.  Importance of compliance with diet and medication stressed and she vocalized understanding.  She was advised to walk at least half an hour a day 5 days a week and she promises to do so. ?Essential hypertension: Blood pressure stable and diet was emphasized.  Lifestyle modification urged. ?Mixed dyslipidemia: Lipids were reviewed.  They are followed by primary care.  She plans to go for blood work fasting in the next visit coming up soon with primary care ?Mild to moderate aortic stenosis by echocardiogram: Asymptomatic.  I educated her about symptoms.  We will do an echocardiogram in the next few days. ?Patient will be seen in follow-up appointment in 6 months or earlier if the patient has any concerns ? ? ? ?Medication Adjustments/Labs and Tests Ordered: ?Current medicines are reviewed at length with the patient today.  Concerns regarding medicines are outlined above.  ?No orders of the defined types were placed in this encounter. ? ?No orders of the defined types were placed in this encounter. ? ? ? ?No chief complaint on file. ?  ? ?History of Present Illness:   ? ?Alyssa Cooper is a 81 y.o. female.  Patient has past medical history of coronary artery disease post CABG surgery, essential hypertension, mixed dyslipidemia and diabetes mellitus.  She is an active lady.  She denies any  problems at this time and takes care of activities of daily living.  No chest pain orthopnea or PND.  Last echocardiogram revealed mild to moderate aortic stenosis.  At the time of my evaluation, the patient is alert awake oriented and in no distress.  Recent blood work has revealed renal insufficiency which is significant. ? ?Past Medical History:  ?Diagnosis Date  ? Abnormal nuclear cardiac imaging test 06/11/2019  ? Abnormal stress test 07/22/2019  ? Anemia, pernicious   ? CAD (coronary artery disease) 05/26/2019  ? CAD S/P percutaneous coronary angioplasty 07/23/2019  ? Chronic ischemic heart disease   ? Chronic kidney disease   ? Chronic kidney disease, stage 3 (moderate) 12/01/2018  ? Comprehensive diabetic foot examination, type 2 DM, encounter for [facility]) 12/23/2019  ? Diabetes mellitus due to underlying condition with unspecified complications ([place]) 6/96/2952  ? Diabetes mellitus without complication ([place]) 8/41/3244  ? Gammopathy, monoclonal 09/25/2016  ? Helicobacter heilmannii gastritis   ? History of melanoma   ? Hx of CABG 05/26/2019  ? Hypertension 12/01/2018  ? Obesity   ? Osteoarthritis   ? Pain due to onychomycosis of toenail of right foot 01/13/2020  ? Peroneal tendonitis, left 12/23/2019  ? Preoperative cardiovascular examination 06/11/2019  ? Primary osteoarthritis of left foot 12/23/2019  ? Pure hypercholesterolemia   ? Renal osteodystrophy 12/01/2018  ? Type 2 diabetes mellitus with diabetic chronic kidney disease ([place]) 12/01/2018  ? Vitamin D deficiency   ? ? ?Past  Surgical History:  ?Procedure Laterality Date  ? ABDOMINAL HYSTERECTOMY    ? BACK SURGERY  2011  ? BUNIONECTOMY    ? BYPASS GRAFT  2000  ? CATARACT EXTRACTION    ? CORONARY STENT INTERVENTION N/A 07/22/2019  ? Procedure: CORONARY STENT INTERVENTION;  Surgeon: Jettie Booze, MD;  Location: [place] [facility];  Service: Cardiovascular;  Laterality: N/A;  ? LEFT HEART CATH AND CORS/GRAFTS ANGIOGRAPHY N/A 07/22/2019  ? Procedure: LEFT HEART CATH  AND CORS/GRAFTS ANGIOGRAPHY;  Surgeon: Jettie Booze, MD;  Location: [place] [facility];  Service: Cardiovascular;  Laterality: N/A;  ? PARTIAL HYSTERECTOMY    ? REPLACEMENT TOTAL KNEE    ? ? ?Current Medications: ?Current Meds  ?Medication Sig  ? aspirin EC 81 MG tablet Take 81 mg by mouth daily.  ? clopidogrel (PLAVIX) 75 MG tablet Take 75 mg by mouth daily.  ? ergocalciferol (VITAMIN D2) 1.25 MG (50000 UT) capsule Take 50,000 Units by mouth 2 (two) times a week.  ? liraglutide (VICTOZA) 18 MG/3ML SOPN Inject 1.8 mg into the skin at bedtime.   ? lisinopril (ZESTRIL) 20 MG tablet Take 20 mg by mouth daily.  ? nitroGLYCERIN (NITROSTAT) 0.4 MG SL tablet Place 0.4 mg under the tongue every 5 (five) minutes as needed for chest pain.  ? rosuvastatin (CRESTOR) 5 MG tablet Take 5 mg by mouth 2 (two) times a week.  ?  ? ?Allergies:   Penicillins, Atorvastatin, Ezetimibe, Motrin [ibuprofen], Nsaids, Pitavastatin, Tramadol, Bactrim [sulfamethoxazole-trimethoprim], and Furosemide  ? ?Social History  ? ?Socioeconomic History  ? Marital status: Widowed  ?  Spouse name: Not on file  ? Number of children: 3  ? Years of education: Not on file  ? Highest education level: 10th grade  ?Occupational History  ? Not on file  ?Tobacco Use  ? Smoking status: Never  ? Smokeless tobacco: Never  ?Vaping Use  ? Vaping Use: Never used  ?Substance and Sexual Activity  ? Alcohol use: Never  ? Drug use: Never  ? Sexual activity: Not on file  ?Other Topics Concern  ? Not on file  ?Social History Narrative  ? Not on file  ? ?Social Determinants of Health  ? ?Financial Resource Strain: Not on file  ?Food Insecurity: Not on file  ?Transportation Needs: Not on file  ?Physical Activity: Not on file  ?Stress: Not on file  ?Social Connections: Not on file  ?  ? ?Family History: ?The patient's family history includes Cancer in her son; Diabetes in her father and sister; Lung cancer in her brother. ? ?ROS:   ?Please see the history of present  illness.    ?All other systems reviewed and are negative. ? ?EKGs/Labs/Other Studies Reviewed:   ? ?The following studies were reviewed today: ?EKG reveals sinus rhythm first-degree AV block and nonspecific ST-T changes ? ? ?Recent Labs: ?05/15/2021: TSH 2.790 ?11/14/2021: ALT 21; BUN 40; Creatinine 2.7; Hemoglobin 11.1; Platelets 126; Potassium 4.0; Sodium 141  ?Recent Lipid Panel ?   ?Component Value Date/Time  ? CHOL 149 05/15/2021 0908  ? TRIG 105 05/15/2021 0908  ? HDL 46 05/15/2021 0908  ? CHOLHDL 3.2 05/15/2021 0908  ? CHOLHDL 4.5 07/23/2019 0934  ? VLDL 27 07/23/2019 0934  ? [place] 84 05/15/2021 0908  ? ? ?Physical Exam:   ? ?VS:  BP 136/80   Pulse 68   Ht '5\' 3"'$  (1.6 m)   Wt 144 lb 9.6 oz (65.6 kg)   SpO2 99%  BMI 25.61 kg/m?    ? ?Wt Readings from Last 3 Encounters:  ?11/16/21 144 lb 9.6 oz (65.6 kg)  ?11/14/21 146 lb 1.6 oz (66.3 kg)  ?05/15/21 150 lb 6.4 oz (68.2 kg)  ?  ? ?GEN: Patient is in no acute distress ?HEENT: Normal ?NECK: No JVD; No carotid bruits ?LYMPHATICS: No lymphadenopathy ?CARDIAC: Hear sounds regular, 2/6 systolic murmur at the apex. ?RESPIRATORY:  Clear to auscultation without rales, wheezing or rhonchi  ?ABDOMEN: Soft, non-tender, non-distended ?MUSCULOSKELETAL:  No edema; No deformity  ?SKIN: Warm and dry ?NEUROLOGIC:  Alert and oriented x 3 ?PSYCHIATRIC:  Normal affect  ? ?Signed, ?Jenean Lindau, MD  ?11/16/2021 9:26 AM    ?[place]  ?

## 2021-11-16 NOTE — Addendum Note (Signed)
Addended by: Jerl Santos R on: 11/16/2021 09:40 AM ? ? Modules accepted: Orders ? ?

## 2021-11-16 NOTE — Patient Instructions (Signed)
Medication Instructions:  ?Your physician recommends that you continue on your current medications as directed. Please refer to the Current Medication list given to you today. ? ?*If you need a refill on your cardiac medications before your next appointment, please call your pharmacy* ? ? ?Lab Work: ?NONE ?If you have labs (blood work) drawn today and your tests are completely normal, you will receive your results only by: ?MyChart Message (if you have MyChart) OR ?A paper copy in the mail ?If you have any lab test that is abnormal or we need to change your treatment, we will call you to review the results. ? ? ?Testing/Procedures: ?Your physician has requested that you have an echocardiogram. Echocardiography is a painless test that uses sound waves to create images of your heart. It provides your doctor with information about the size and shape of your heart and how well your heart?s chambers and valves are working. This procedure takes approximately one hour. There are no restrictions for this procedure.  ? ? ?Follow-Up: ?At [facility], you and your health needs are our priority.  As part of our continuing mission to provide you with exceptional heart care, we have created designated Provider Care Teams.  These Care Teams include your primary Cardiologist (physician) and Advanced Practice Providers (APPs -  Physician Assistants and Nurse Practitioners) who all work together to provide you with the care you need, when you need it. ? ?We recommend signing up for the patient portal called "MyChart".  Sign up information is provided on this After Visit Summary.  MyChart is used to connect with patients for Virtual Visits (Telemedicine).  Patients are able to view lab/test results, encounter notes, upcoming appointments, etc.  Non-urgent messages can be sent to your provider as well.   ?To learn more about what you can do with MyChart, go to [email or web address].   ? ?Your next appointment:   ?9 month(s) ? ?The  format for your next appointment:   ?In Person ? ?Provider:   ?Jyl Heinz, MD  ? ? ?Other Instructions ?  ?

## 2021-11-19 ENCOUNTER — Encounter: Payer: Self-pay | Admitting: Oncology

## 2021-11-19 LAB — PROTEIN ELECTROPHORESIS, SERUM
A/G Ratio: 1.4 (ref 0.7–1.7)
Albumin ELP: 3.9 g/dL (ref 2.9–4.4)
Alpha-1-Globulin: 0.2 g/dL (ref 0.0–0.4)
Alpha-2-Globulin: 0.8 g/dL (ref 0.4–1.0)
Beta Globulin: 1 g/dL (ref 0.7–1.3)
Gamma Globulin: 0.8 g/dL (ref 0.4–1.8)
Globulin, Total: 2.8 g/dL (ref 2.2–3.9)
Total Protein ELP: 6.7 g/dL (ref 6.0–8.5)

## 2021-11-20 ENCOUNTER — Telehealth: Payer: Self-pay

## 2021-11-20 NOTE — Telephone Encounter (Signed)
Patient notified

## 2021-11-20 NOTE — Telephone Encounter (Signed)
-----   Message from Derwood Kaplan, MD sent at 11/19/2021  3:43 PM EDT ----- ?Regarding: call ?Tell her vitamin levels are good but B12 low side of normal, she might benefit from taking 1 daily. ?Protein tests look good ? ?

## 2021-11-21 ENCOUNTER — Other Ambulatory Visit: Payer: Self-pay

## 2021-11-21 ENCOUNTER — Ambulatory Visit (INDEPENDENT_AMBULATORY_CARE_PROVIDER_SITE_OTHER): Payer: Medicare Other

## 2021-11-21 DIAGNOSIS — I2510 Atherosclerotic heart disease of native coronary artery without angina pectoris: Secondary | ICD-10-CM

## 2021-11-21 DIAGNOSIS — E088 Diabetes mellitus due to underlying condition with unspecified complications: Secondary | ICD-10-CM

## 2021-11-21 DIAGNOSIS — Z951 Presence of aortocoronary bypass graft: Secondary | ICD-10-CM | POA: Diagnosis not present

## 2021-11-21 DIAGNOSIS — E7800 Pure hypercholesterolemia, unspecified: Secondary | ICD-10-CM

## 2021-11-21 DIAGNOSIS — I350 Nonrheumatic aortic (valve) stenosis: Secondary | ICD-10-CM

## 2021-11-21 LAB — ECHOCARDIOGRAM COMPLETE
AR max vel: 1.11 cm2
AV Area VTI: 1.19 cm2
AV Area mean vel: 1.06 cm2
AV Mean grad: 7.5 mmHg
AV Peak grad: 12.5 mmHg
Ao pk vel: 1.77 m/s
Area-P 1/2: 4.8 cm2
MV M vel: 4.76 m/s
MV Peak grad: 90.6 mmHg
MV VTI: 1.38 cm2
S' Lateral: 3 cm

## 2021-12-04 DIAGNOSIS — D045 Carcinoma in situ of skin of trunk: Secondary | ICD-10-CM | POA: Diagnosis not present

## 2021-12-04 DIAGNOSIS — L578 Other skin changes due to chronic exposure to nonionizing radiation: Secondary | ICD-10-CM | POA: Diagnosis not present

## 2021-12-04 DIAGNOSIS — L821 Other seborrheic keratosis: Secondary | ICD-10-CM | POA: Diagnosis not present

## 2021-12-04 DIAGNOSIS — C44529 Squamous cell carcinoma of skin of other part of trunk: Secondary | ICD-10-CM | POA: Diagnosis not present

## 2021-12-07 DIAGNOSIS — I10 Essential (primary) hypertension: Secondary | ICD-10-CM | POA: Diagnosis not present

## 2021-12-07 DIAGNOSIS — E785 Hyperlipidemia, unspecified: Secondary | ICD-10-CM | POA: Diagnosis not present

## 2021-12-13 DIAGNOSIS — Z6825 Body mass index (BMI) 25.0-25.9, adult: Secondary | ICD-10-CM | POA: Diagnosis not present

## 2021-12-13 DIAGNOSIS — Z780 Asymptomatic menopausal state: Secondary | ICD-10-CM | POA: Diagnosis not present

## 2021-12-13 DIAGNOSIS — Z01419 Encounter for gynecological examination (general) (routine) without abnormal findings: Secondary | ICD-10-CM | POA: Diagnosis not present

## 2021-12-20 DIAGNOSIS — D045 Carcinoma in situ of skin of trunk: Secondary | ICD-10-CM | POA: Diagnosis not present

## 2021-12-24 DIAGNOSIS — H109 Unspecified conjunctivitis: Secondary | ICD-10-CM | POA: Diagnosis not present

## 2021-12-24 DIAGNOSIS — J40 Bronchitis, not specified as acute or chronic: Secondary | ICD-10-CM | POA: Diagnosis not present

## 2021-12-24 DIAGNOSIS — J302 Other seasonal allergic rhinitis: Secondary | ICD-10-CM | POA: Diagnosis not present

## 2021-12-24 DIAGNOSIS — J329 Chronic sinusitis, unspecified: Secondary | ICD-10-CM | POA: Diagnosis not present

## 2022-01-06 DIAGNOSIS — I10 Essential (primary) hypertension: Secondary | ICD-10-CM | POA: Diagnosis not present

## 2022-01-06 DIAGNOSIS — E785 Hyperlipidemia, unspecified: Secondary | ICD-10-CM | POA: Diagnosis not present

## 2022-02-06 DIAGNOSIS — E1129 Type 2 diabetes mellitus with other diabetic kidney complication: Secondary | ICD-10-CM | POA: Diagnosis not present

## 2022-02-06 DIAGNOSIS — E785 Hyperlipidemia, unspecified: Secondary | ICD-10-CM | POA: Diagnosis not present

## 2022-02-06 DIAGNOSIS — I10 Essential (primary) hypertension: Secondary | ICD-10-CM | POA: Diagnosis not present

## 2022-02-18 DIAGNOSIS — N184 Chronic kidney disease, stage 4 (severe): Secondary | ICD-10-CM | POA: Diagnosis not present

## 2022-02-18 DIAGNOSIS — N250 Renal osteodystrophy: Secondary | ICD-10-CM | POA: Diagnosis not present

## 2022-02-18 DIAGNOSIS — E1122 Type 2 diabetes mellitus with diabetic chronic kidney disease: Secondary | ICD-10-CM | POA: Diagnosis not present

## 2022-02-18 DIAGNOSIS — I10 Essential (primary) hypertension: Secondary | ICD-10-CM | POA: Diagnosis not present

## 2022-02-20 DIAGNOSIS — E1129 Type 2 diabetes mellitus with other diabetic kidney complication: Secondary | ICD-10-CM | POA: Diagnosis not present

## 2022-03-15 DIAGNOSIS — H26491 Other secondary cataract, right eye: Secondary | ICD-10-CM | POA: Diagnosis not present

## 2022-03-25 ENCOUNTER — Telehealth: Payer: Self-pay | Admitting: Cardiology

## 2022-03-25 NOTE — Telephone Encounter (Signed)
Last cath 11/20  Mid RCA lesion is 100% stenosed. SVG to RCA is occluded. Mid Cx lesion is 75% stenosed. SVG to OM is occluded. A drug-eluting stent was successfully placed using a STENT RESOLUTE ONYX 2.0X22. Post intervention, there is a 0% residual stenosis. Ramus lesion is 75% stenosed. A drug-eluting stent was successfully placed using a STENT RESOLUTE ONYX 2.0X26. Post intervention, there is a 0% residual stenosis. Mid LAD lesion is 100% stenosed. LIMA to LAD. 2nd Diag lesion is 80% stenosed. This is a small diffusely diseased vessel. LV end diastolic pressure is normal. There is no aortic valve stenosis.   Continue aggressive secondary prevention.  Watch overnight.  Hydration for renal disease.

## 2022-03-25 NOTE — Addendum Note (Signed)
Addended by: Truddie Hidden on: 03/25/2022 03:12 PM   Modules accepted: Orders

## 2022-03-25 NOTE — Telephone Encounter (Signed)
Pt c/o medication issue:  1. Name of Medication: aspirin EC 81 MG tablet  clopidogrel (PLAVIX) 75 MG tablet   2. How are you currently taking this medication (dosage and times per day)?   3. Are you having a reaction (difficulty breathing--STAT)? No  4. What is your medication issue? States that pt has been having a lot of bruising. She would like to know if is able to take just one of medications or does she have to stay on both. Please advise

## 2022-03-25 NOTE — Telephone Encounter (Signed)
Recommendations reviewed with pt as per Dr. Julien Nordmann note.  Pt verbalized understanding and had no additional questions.  LVM for [place] with recommendation of stopping aspirin daily.

## 2022-04-01 ENCOUNTER — Other Ambulatory Visit: Payer: Self-pay | Admitting: Cardiology

## 2022-04-08 DIAGNOSIS — I10 Essential (primary) hypertension: Secondary | ICD-10-CM | POA: Diagnosis not present

## 2022-04-08 DIAGNOSIS — E785 Hyperlipidemia, unspecified: Secondary | ICD-10-CM | POA: Diagnosis not present

## 2022-05-03 DIAGNOSIS — E1129 Type 2 diabetes mellitus with other diabetic kidney complication: Secondary | ICD-10-CM | POA: Diagnosis not present

## 2022-05-03 DIAGNOSIS — S41119A Laceration without foreign body of unspecified upper arm, initial encounter: Secondary | ICD-10-CM | POA: Diagnosis not present

## 2022-05-03 DIAGNOSIS — E559 Vitamin D deficiency, unspecified: Secondary | ICD-10-CM | POA: Diagnosis not present

## 2022-05-03 DIAGNOSIS — E7800 Pure hypercholesterolemia, unspecified: Secondary | ICD-10-CM | POA: Diagnosis not present

## 2022-05-03 DIAGNOSIS — M1990 Unspecified osteoarthritis, unspecified site: Secondary | ICD-10-CM | POA: Diagnosis not present

## 2022-05-03 DIAGNOSIS — Z23 Encounter for immunization: Secondary | ICD-10-CM | POA: Diagnosis not present

## 2022-05-03 DIAGNOSIS — Z6823 Body mass index (BMI) 23.0-23.9, adult: Secondary | ICD-10-CM | POA: Diagnosis not present

## 2022-05-09 DIAGNOSIS — I10 Essential (primary) hypertension: Secondary | ICD-10-CM | POA: Diagnosis not present

## 2022-05-09 DIAGNOSIS — E785 Hyperlipidemia, unspecified: Secondary | ICD-10-CM | POA: Diagnosis not present

## 2022-06-04 DIAGNOSIS — D0471 Carcinoma in situ of skin of right lower limb, including hip: Secondary | ICD-10-CM | POA: Diagnosis not present

## 2022-06-04 DIAGNOSIS — L821 Other seborrheic keratosis: Secondary | ICD-10-CM | POA: Diagnosis not present

## 2022-06-04 DIAGNOSIS — L578 Other skin changes due to chronic exposure to nonionizing radiation: Secondary | ICD-10-CM | POA: Diagnosis not present

## 2022-06-04 DIAGNOSIS — Z85820 Personal history of malignant melanoma of skin: Secondary | ICD-10-CM | POA: Diagnosis not present

## 2022-06-07 DIAGNOSIS — J329 Chronic sinusitis, unspecified: Secondary | ICD-10-CM | POA: Diagnosis not present

## 2022-06-07 DIAGNOSIS — J40 Bronchitis, not specified as acute or chronic: Secondary | ICD-10-CM | POA: Diagnosis not present

## 2022-06-07 DIAGNOSIS — U071 COVID-19: Secondary | ICD-10-CM | POA: Diagnosis not present

## 2022-06-08 DIAGNOSIS — I10 Essential (primary) hypertension: Secondary | ICD-10-CM | POA: Diagnosis not present

## 2022-06-08 DIAGNOSIS — E785 Hyperlipidemia, unspecified: Secondary | ICD-10-CM | POA: Diagnosis not present

## 2022-08-08 DIAGNOSIS — E785 Hyperlipidemia, unspecified: Secondary | ICD-10-CM | POA: Diagnosis not present

## 2022-08-08 DIAGNOSIS — I10 Essential (primary) hypertension: Secondary | ICD-10-CM | POA: Diagnosis not present

## 2022-09-10 ENCOUNTER — Encounter: Payer: Self-pay | Admitting: Cardiology

## 2022-09-10 ENCOUNTER — Ambulatory Visit: Payer: Medicare Other | Attending: Cardiology | Admitting: Cardiology

## 2022-09-10 VITALS — BP 180/90 | HR 58 | Ht 63.0 in | Wt 134.0 lb

## 2022-09-10 DIAGNOSIS — Z951 Presence of aortocoronary bypass graft: Secondary | ICD-10-CM | POA: Diagnosis not present

## 2022-09-10 DIAGNOSIS — E1129 Type 2 diabetes mellitus with other diabetic kidney complication: Secondary | ICD-10-CM | POA: Diagnosis not present

## 2022-09-10 DIAGNOSIS — Z9861 Coronary angioplasty status: Secondary | ICD-10-CM

## 2022-09-10 DIAGNOSIS — I2510 Atherosclerotic heart disease of native coronary artery without angina pectoris: Secondary | ICD-10-CM

## 2022-09-10 DIAGNOSIS — E088 Diabetes mellitus due to underlying condition with unspecified complications: Secondary | ICD-10-CM | POA: Diagnosis not present

## 2022-09-10 DIAGNOSIS — I10 Essential (primary) hypertension: Secondary | ICD-10-CM

## 2022-09-10 NOTE — Patient Instructions (Signed)
Medication Instructions:  Your physician recommends that you continue on your current medications as directed. Please refer to the Current Medication list given to you today.  *If you need a refill on your cardiac medications before your next appointment, please call your pharmacy*   Lab Work: None ordered If you have labs (blood work) drawn today and your tests are completely normal, you will receive your results only by: MyChart Message (if you have MyChart) OR A paper copy in the mail If you have any lab test that is abnormal or we need to change your treatment, we will call you to review the results.   Testing/Procedures: None ordered   Follow-Up: At [facility], you and your health needs are our priority.  As part of our continuing mission to provide you with exceptional heart care, we have created designated Provider Care Teams.  These Care Teams include your primary Cardiologist (physician) and Advanced Practice Providers (APPs -  Physician Assistants and Nurse Practitioners) who all work together to provide you with the care you need, when you need it.  We recommend signing up for the patient portal called "MyChart".  Sign up information is provided on this After Visit Summary.  MyChart is used to connect with patients for Virtual Visits (Telemedicine).  Patients are able to view lab/test results, encounter notes, upcoming appointments, etc.  Non-urgent messages can be sent to your provider as well.   To learn more about what you can do with MyChart, go to [email or web address].    Your next appointment:   9 month(s)  The format for your next appointment:   In Person  Provider:   Rajan Revankar, MD    Other Instructions none  Important Information About Sugar       

## 2022-09-10 NOTE — Progress Notes (Signed)
Cardiology Office Note:     Date:  09/10/2022   ID:  Alyssa Cooper, DOB 05/22/1941, MRN [id number]  PCP:  Angelina Sheriff, MD  Cardiologist:  Jenean Lindau, MD   Referring MD: Angelina Sheriff, MD    ASSESSMENT:    1. Coronary artery disease involving native coronary artery of native heart without angina pectoris   2. CAD S/P percutaneous coronary angioplasty   3. Primary hypertension   4. Diabetes mellitus due to underlying condition with unspecified complications ([place])   5. Hx of CABG    PLAN:    In order of problems listed above:  Coronary artery disease: Secondary prevention stressed with patient.  Importance of compliance with diet medication stressed and she vocalized understanding.  She was advised to walk on a regular basis to the best of her ability. Essential hypertension: Blood pressure medications have been changed by primary care.  They did blood work today.  They are going to keep a track of her blood pressure and blood work in the next couple of weeks.  I reviewed written instructions that were given to her by them. Mixed hyper lipidemia: On lipid-lowering medications.  Diet emphasized.  Lipids reviewed. Diabetes mellitus: Diet emphasized. Advanced renal insufficiency: Care needs to be exercised especially since she is on ACE inhibitors.  Her renal function needs to be monitored closely and this will be followed by primary care. Patient will be seen in follow-up appointment in 9 months or earlier if the patient has any concerns    Medication Adjustments/Labs and Tests Ordered: Current medicines are reviewed at length with the patient today.  Concerns regarding medicines are outlined above.  No orders of the defined types were placed in this encounter.  No orders of the defined types were placed in this encounter.    Chief Complaint  Patient presents with   Follow-up     History of Present Illness:    Alyssa Cooper is a 82 y.o. female.   Patient has past medical history of coronary artery disease, essential hypertension, dyslipidemia, diabetes mellitus.  She has advanced renal insufficiency.  She denies any problems at this time and takes care of activities of daily living.  No chest pain orthopnea or PND.  Past Medical History:  Diagnosis Date   Abnormal nuclear cardiac imaging test 06/11/2019   Abnormal stress test 07/22/2019   Anemia, pernicious    CAD (coronary artery disease) 05/26/2019   CAD S/P percutaneous coronary angioplasty 07/23/2019   Chronic ischemic heart disease    Chronic kidney disease    Chronic kidney disease, stage 3 (moderate) 12/01/2018   Comprehensive diabetic foot examination, type 2 DM, encounter for ([place]) 12/23/2019   Diabetes mellitus due to underlying condition with unspecified complications ([place]) 4/40/1027   Diabetes mellitus without complication ([place]) 2/53/6644   Gammopathy, monoclonal 0/34/7425   Helicobacter heilmannii gastritis    History of melanoma    Hx of CABG 05/26/2019   Hypertension 12/01/2018   Obesity    Osteoarthritis    Pain due to onychomycosis of toenail of right foot 01/13/2020   Peroneal tendonitis, left 12/23/2019   Preoperative cardiovascular examination 06/11/2019   Primary osteoarthritis of left foot 12/23/2019   Pure hypercholesterolemia    Renal osteodystrophy 12/01/2018   Type 2 diabetes mellitus with diabetic chronic kidney disease ([place]) 12/01/2018   Vitamin D deficiency     Past Surgical History:  Procedure Laterality Date   ABDOMINAL HYSTERECTOMY  BACK SURGERY  2011   BUNIONECTOMY     BYPASS GRAFT  2000   CATARACT EXTRACTION     CORONARY STENT INTERVENTION N/A 07/22/2019   Procedure: CORONARY STENT INTERVENTION;  Surgeon: Jettie Booze, MD;  Location: [place] [facility];  Service: Cardiovascular;  Laterality: N/A;   LEFT HEART CATH AND CORS/GRAFTS ANGIOGRAPHY N/A 07/22/2019   Procedure: LEFT HEART CATH AND CORS/GRAFTS ANGIOGRAPHY;  Surgeon: Jettie Booze, MD;  Location: [place] [facility];  Service: Cardiovascular;  Laterality: N/A;   PARTIAL HYSTERECTOMY     REPLACEMENT TOTAL KNEE      Current Medications: Current Meds  Medication Sig   carvedilol (COREG) 25 MG tablet Take 25 mg by mouth 2 (two) times daily.   clopidogrel (PLAVIX) 75 MG tablet Take 75 mg by mouth daily.   COLLAGEN PO Take 1-2 tablets by mouth daily.   ergocalciferol (VITAMIN D2) 1.25 MG (50000 UT) capsule Take 50,000 Units by mouth 2 (two) times a week.   liraglutide (VICTOZA) 18 MG/3ML SOPN Inject 1.8 mg into the skin at bedtime.    nitroGLYCERIN (NITROSTAT) 0.4 MG SL tablet Place 1 tablet (0.4 mg total) under the tongue every 5 (five) minutes as needed for chest pain.   ramipril (ALTACE) 5 MG capsule Take 5 mg by mouth daily.   rosuvastatin (CRESTOR) 5 MG tablet Take 5 mg by mouth 2 (two) times a week.     Allergies:   Penicillins, Atorvastatin, Ezetimibe, Metformin, Motrin [ibuprofen], Nsaids, Pitavastatin, Tramadol, Bactrim [sulfamethoxazole-trimethoprim], and Furosemide   Social History   Socioeconomic History   Marital status: Widowed    Spouse name: Not on file   Number of children: 3   Years of education: Not on file   Highest education level: 10th grade  Occupational History   Not on file  Tobacco Use   Smoking status: Never   Smokeless tobacco: Never  Vaping Use   Vaping Use: Never used  Substance and Sexual Activity   Alcohol use: Never   Drug use: Never   Sexual activity: Not on file  Other Topics Concern   Not on file  Social History Narrative   Not on file   Social Determinants of Health   Financial Resource Strain: Not on file  Food Insecurity: Not on file  Transportation Needs: Not on file  Physical Activity: Not on file  Stress: Not on file  Social Connections: Not on file     Family History: The patient's family history includes Cancer in her son; Diabetes in her father and sister; Lung cancer in her  brother.  ROS:   Please see the history of present illness.    All other systems reviewed and are negative.  EKGs/Labs/Other Studies Reviewed:    The following studies were reviewed today: I discussed my findings with the patient at length.   Recent Labs: 11/14/2021: ALT 21; BUN 40; Creatinine 2.7; Hemoglobin 11.1; Platelets 126; Potassium 4.0; Sodium 141  Recent Lipid Panel    Component Value Date/Time   CHOL 149 05/15/2021 0908   TRIG 105 05/15/2021 0908   HDL 46 05/15/2021 0908   CHOLHDL 3.2 05/15/2021 0908   CHOLHDL 4.5 07/23/2019 0934   VLDL 27 07/23/2019 0934   LDLCALC 84 05/15/2021 0908    Physical Exam:    VS:  BP (!) 180/90 (BP Location: Left Arm, Patient Position: Sitting, Cuff Size: Normal)   Pulse (!) 58   Ht '5\' 3"'$  (1.6 m)   Wt 134 lb (  60.8 kg)   SpO2 98%   BMI 23.74 kg/m     Wt Readings from Last 3 Encounters:  09/10/22 134 lb (60.8 kg)  11/16/21 144 lb 9.6 oz (65.6 kg)  11/14/21 146 lb 1.6 oz (66.3 kg)     GEN: Patient is in no acute distress HEENT: Normal NECK: No JVD; No carotid bruits LYMPHATICS: No lymphadenopathy CARDIAC: Hear sounds regular, 2/6 systolic murmur at the apex. RESPIRATORY:  Clear to auscultation without rales, wheezing or rhonchi  ABDOMEN: Soft, non-tender, non-distended MUSCULOSKELETAL:  No edema; No deformity  SKIN: Warm and dry NEUROLOGIC:  Alert and oriented x 3 PSYCHIATRIC:  Normal affect   Signed, Jenean Lindau, MD  09/10/2022 3:38 PM    [facility]

## 2022-09-20 ENCOUNTER — Telehealth: Payer: Self-pay

## 2022-09-20 NOTE — Patient Outreach (Signed)
  Care Coordination   09/20/2022 Name: Alyssa Cooper MRN: [id number] DOB: 1940-11-19   Care Coordination Outreach Attempts:  An unsuccessful telephone outreach was attempted today to offer the patient information about available care coordination services as a benefit of their health plan.   Follow Up Plan:  Additional outreach attempts will be made to offer the patient care coordination information and services.   Encounter Outcome:  No Answer   Care Coordination Interventions:  No, not indicated    Tomasa Rand, RN, BSN, [facility] [employer] [phone]

## 2022-09-23 DIAGNOSIS — N184 Chronic kidney disease, stage 4 (severe): Secondary | ICD-10-CM | POA: Diagnosis not present

## 2022-09-23 DIAGNOSIS — N250 Renal osteodystrophy: Secondary | ICD-10-CM | POA: Diagnosis not present

## 2022-09-23 DIAGNOSIS — E1122 Type 2 diabetes mellitus with diabetic chronic kidney disease: Secondary | ICD-10-CM | POA: Diagnosis not present

## 2022-09-23 DIAGNOSIS — I10 Essential (primary) hypertension: Secondary | ICD-10-CM | POA: Diagnosis not present

## 2022-09-25 ENCOUNTER — Telehealth: Payer: Self-pay

## 2022-09-25 NOTE — Patient Outreach (Signed)
  Care Coordination   09/25/2022 Name: Alyssa Cooper MRN: [id number] DOB: [birth year]   Care Coordination Outreach Attempts:  A second unsuccessful outreach was attempted today to offer the patient with information about available care coordination services as a benefit of their health plan.     Follow Up Plan:  Additional outreach attempts will be made to offer the patient care coordination information and services.   Encounter Outcome:  No Answer   Care Coordination Interventions:  No, not indicated    Tomasa Rand, RN, BSN, [facility] [employer] [phone]

## 2022-09-26 DIAGNOSIS — L820 Inflamed seborrheic keratosis: Secondary | ICD-10-CM | POA: Diagnosis not present

## 2022-09-26 DIAGNOSIS — D0471 Carcinoma in situ of skin of right lower limb, including hip: Secondary | ICD-10-CM | POA: Diagnosis not present

## 2022-09-26 DIAGNOSIS — L570 Actinic keratosis: Secondary | ICD-10-CM | POA: Diagnosis not present

## 2022-10-08 ENCOUNTER — Telehealth: Payer: Self-pay

## 2022-10-08 NOTE — Patient Outreach (Signed)
  Care Coordination   10/08/2022 Name: Meggan Dhaliwal MRN: [id number] DOB: May 13, 1941   Care Coordination Outreach Attempts:  A third unsuccessful outreach was attempted today to offer the patient with information about available care coordination services as a benefit of their health plan.   Follow Up Plan:  No further outreach attempts will be made at this time. We have been unable to contact the patient to offer or enroll patient in care coordination services  Encounter Outcome:  No Answer   Care Coordination Interventions:  No, not indicated    Tomasa Rand, RN, BSN, [facility] [place] Coordinator [phone]

## 2022-10-09 DIAGNOSIS — E1129 Type 2 diabetes mellitus with other diabetic kidney complication: Secondary | ICD-10-CM | POA: Diagnosis not present

## 2022-10-09 DIAGNOSIS — I10 Essential (primary) hypertension: Secondary | ICD-10-CM | POA: Diagnosis not present

## 2022-10-09 DIAGNOSIS — N189 Chronic kidney disease, unspecified: Secondary | ICD-10-CM | POA: Diagnosis not present

## 2022-11-07 DIAGNOSIS — I10 Essential (primary) hypertension: Secondary | ICD-10-CM | POA: Diagnosis not present

## 2022-11-07 DIAGNOSIS — N189 Chronic kidney disease, unspecified: Secondary | ICD-10-CM | POA: Diagnosis not present

## 2022-11-07 DIAGNOSIS — E1129 Type 2 diabetes mellitus with other diabetic kidney complication: Secondary | ICD-10-CM | POA: Diagnosis not present

## 2022-11-14 NOTE — Progress Notes (Signed)
ADDENDUM:  Her SPEP is once again negative for a monoclonal spike and the serum immunoglobulin levels are normal. However, her kappa serum light chains are elevated at 59.0 with a ratio elevated at 2.31. This is mildly abnormal but I will recommend that I check her again in 1 year.  Her anemia is worse with hemoglobin of 10.6, down from 11.1 last year, with MCV 96. Evaluation last year revealed normal iron, B12 and folate levels. Her platelets are mildly low but improved at 134,000. Her creatinine is stable at 2.51 with BUN of 60 for an EGFR of 19. This is likely the cause of her worsening anemia.  I will therefore recommend she return in 1 year with CBC, CMP, SPEP, quantitative immunoglobulins, serum light chains, and repeat B12, folate and iron/TIBC with ferritin level.    [facility]  [address] [place],  [place]  [address] [phone]  Clinic Day:  11/15/22  Referring physician: Noni Saupe, MD  CHIEF COMPLAINT:  CC: Benign monoclonal gammopathy   Current Treatment:  Surveillance  HISTORY OF PRESENT ILLNESS:  Alyssa Cooper is a 82 y.o. female with a history of a tiny monoclonal spike of IgG lambda.  Her quantitative immunoglobulins were normal and a urine protein electrophoresis was negative.  She does have chronic kidney disease and her creatinine was 1.3 at that time.  The monoclonal gammopathy was detected by her nephrologist.  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  We recommended that we simply monitor this.  When she was seen in 2019, the M spike was still quite low at 0.2 g per dL, and her quantitative immunoglobulins were normal.  Her husband expired on June 28, 2018.  Repeat labs in February of 2020 revealed a stable M-spike of 0.2.  In February of 2022, the M spike was undetectable but in July of 2022, it was 0.1. It was undetectable again in 2023.  INTERVAL HISTORY:  Alyssa Cooper is here for routine follow up and states that  she is doing fairly well. She has severe arthritis of bilateral shoulders and they have recommended surgery on the right. Her serum protein electrophoresis has been negative in 2022 and 2023.  She denies fatigue and states that she remains active. Her creatinine was up to 2.1 at Dr. Marnee Cooper office last year and then up to 2.7 with an EGFR of 17. Hemoglobin had decreased from 12.4 to 11.1 and platelets have decreased from 153,000 to 126,000 last year.  Her  appetite is fair, and she has lost 10 pounds over the last year. Today's labs are pending.  She denies fever, chills or other signs of infection.  She denies nausea, vomiting, bowel issues, or abdominal pain.  She denies sore throat, cough, dyspnea, or chest pain.   REVIEW OF SYSTEMS:  Review of Systems  Constitutional: Negative.  Negative for appetite change, chills, fatigue, fever and unexpected weight change.  HENT:  Negative.    Eyes: Negative.   Respiratory: Negative.  Negative for chest tightness, cough, hemoptysis, shortness of breath and wheezing.   Cardiovascular: Negative.  Negative for chest pain, leg swelling and palpitations.  Gastrointestinal: Negative.  Negative for abdominal distention, abdominal pain, blood in stool, constipation, diarrhea, nausea and vomiting.  Endocrine: Negative.   Genitourinary: Negative.  Negative for difficulty urinating, dysuria, frequency and hematuria.   Musculoskeletal:  Positive for arthralgias (especially of the bilateral shoulders). Negative for back pain, flank pain, gait problem and myalgias.  Skin: Negative.   Neurological: Negative.  Negative for dizziness, extremity weakness, gait problem, headaches, light-headedness, numbness, seizures and speech difficulty.  Hematological: Negative.   Psychiatric/Behavioral: Negative.  Negative for depression and sleep disturbance. The patient is not nervous/anxious.      VITALS:  Blood pressure (!) 166/76, pulse (!) 59, temperature 97.9 F (36.6 C),  temperature source Oral, resp. rate 18, height 5\' 3"  (1.6 m), weight 134 lb 6.4 oz (61 kg), SpO2 100 %.  Wt Readings from Last 3 Encounters:  11/15/22 134 lb 6.4 oz (61 kg)  09/10/22 134 lb (60.8 kg)  11/16/21 144 lb 9.6 oz (65.6 kg)    Body mass index is 23.81 kg/m.  Performance status (ECOG): 0 - Asymptomatic  PHYSICAL EXAM:  Physical Exam Constitutional:      General: She is not in acute distress.    Appearance: Normal appearance. She is normal weight.  HENT:     Head: Normocephalic and atraumatic.  Eyes:     General: No scleral icterus.    Extraocular Movements: Extraocular movements intact.     Conjunctiva/sclera: Conjunctivae normal.     Pupils: Pupils are equal, round, and reactive to light.  Cardiovascular:     Rate and Rhythm: Normal rate and regular rhythm.     Pulses: Normal pulses.     Heart sounds: Normal heart sounds. No murmur heard.    No friction rub. No gallop.  Pulmonary:     Effort: Pulmonary effort is normal. No respiratory distress.     Breath sounds: Normal breath sounds.  Abdominal:     General: Bowel sounds are normal. There is no distension.     Palpations: Abdomen is soft. There is no hepatomegaly, splenomegaly or mass.     Tenderness: There is no abdominal tenderness.  Musculoskeletal:        General: Normal range of motion.     Cervical back: Normal range of motion and neck supple.     Right lower leg: No edema.     Left lower leg: No edema.     Comments: Right shoulder is prominent  Lymphadenopathy:     Cervical: No cervical adenopathy.  Skin:    General: Skin is warm and dry.  Neurological:     General: No focal deficit present.     Mental Status: She is alert and oriented to person, place, and time. Mental status is at baseline.  Psychiatric:        Mood and Affect: Mood normal.        Behavior: Behavior normal.        Thought Content: Thought content normal.        Judgment: Judgment normal.     LABS:      Latest Ref Rng &  Units 11/15/2022    9:23 AM 11/14/2021   12:00 AM 05/15/2021    9:08 AM  CBC  WBC 4.0 - 10.5 K/uL 6.2  7.0  7.5   Hemoglobin 12.0 - 15.0 g/dL 13.0  86.5  78.4   Hematocrit 36.0 - 46.0 % 33.1  34  36.9   Platelets 150 - 400 K/uL 134  126  155       Latest Ref Rng & Units 11/15/2022    9:23 AM 11/14/2021   12:00 AM 05/15/2021    9:08 AM  CMP  Glucose 70 - 99 mg/dL 696   295   BUN 8 - 23 mg/dL 60  40  39   Creatinine 0.44 - 1.00  mg/dL 9.60  2.7  4.54   Sodium 135 - 145 mmol/L 142  141  144   Potassium 3.5 - 5.1 mmol/L 4.0  4.0  4.2   Chloride 98 - 111 mmol/L 108  108  109   CO2 22 - 32 mmol/L Calcium 8.9 - 10.3 mg/dL 9.6  09.8  11.9   Total Protein 6.5 - 8.1 g/dL 6.5   6.4   Total Bilirubin 0.3 - 1.2 mg/dL 0.4   0.3   Alkaline Phos 38 - 126 U/L 43  53  61   AST 15 - 41 U/L 19  33  12   ALT 0 - 44 U/L Lab Results  Component Value Date   TOTALPROTELP 6.2 11/15/2022   ALBUMINELP 3.8 11/15/2022   A1GS 0.1 11/15/2022   A2GS 0.7 11/15/2022   BETS 0.8 11/15/2022   GAMS 0.8 11/15/2022   MSPIKE Not Observed 11/15/2022   SPEI Comment 11/15/2022    STUDIES:  No results found.    HISTORY:   Allergies:  Allergies  Allergen Reactions   Penicillins Hives    Did it involve swelling of the face/tongue/throat, SOB, or low BP? No  Did it involve sudden or severe rash/hives, skin peeling, or any reaction on the inside of your mouth or nose? Yes  Did you need to seek medical attention at a hospital or doctor's office? Yes  When did it last happen?     in her 30s?   If all above answers are "NO", may proceed with cephalosporin use.  Did it involve swelling of the face/tongue/throat, SOB, or low BP? No, Did it involve sudden or severe rash/hives, skin peeling, or any reaction on the inside of your mouth or nose? Yes, Did you need to seek medical attention at a hospital or doctor's office? Yes, When did it last happen?     in her 30s? , If all above answers are  "NO", may proceed with cephalosporin use.   Atorvastatin Other (See Comments)    Joint pain   Ezetimibe Other (See Comments)    Muscle aches     Metformin Other (See Comments)   Motrin [Ibuprofen]    Nsaids    Pitavastatin Other (See Comments)   Tramadol Other (See Comments)   Bactrim [Sulfamethoxazole-Trimethoprim] Rash   Furosemide Rash    Current Medications: Current Outpatient Medications  Medication Sig Dispense Refill   carvedilol (COREG) 25 MG tablet Take 25 mg by mouth 2 (two) times daily.     clopidogrel (PLAVIX) 75 MG tablet Take 75 mg by mouth daily.     COLLAGEN PO Take 1-2 tablets by mouth daily.     ergocalciferol (VITAMIN D2) 1.25 MG (50000 UT) capsule Take 50,000 Units by mouth 2 (two) times a week.     liraglutide (VICTOZA) 18 MG/3ML SOPN Inject 1.8 mg into the skin at bedtime.      nitroGLYCERIN (NITROSTAT) 0.4 MG SL tablet Place 1 tablet (0.4 mg total) under the tongue every 5 (five) minutes as needed for chest pain. 25 tablet 7   ramipril (ALTACE) 5 MG capsule Take 5 mg by mouth daily.     rosuvastatin (CRESTOR) 5 MG tablet Take 5 mg by mouth 2 (two) times a week.     No current facility-administered medications for this visit.     ASSESSMENT & PLAN:   Assessment/Plan:   Benign monoclonal gammopathy, last measuring  0.1 in July 2022. Today's results are pending.  Anemia, new. We will evaluate for nutritional deficiency with iron studies, B12 and folate.   Chronic kidney disease, worsening. This may be contributing to her worsening anemia. She is being followed by nephrology.   I will contact her with the results of her other labs. Since her SPEP has been negative for a monoclonal spike for the last 2 years, I will not schedule a routine follow up but will just see her as needed, and recommend an SPEP yearly with quantitative immunoglobulins and serum light chains. I will be glad to see her back if the M spike re-appears or she needs other hematologic  evaluation. The patient understands the plans discussed today and is in agreement with them.  She knows to contact our office if she develops concerns prior to her next appointment.  I provided 20 minutes of face-to-face time during this this encounter and > 50% was spent counseling as documented under my assessment and plan.

## 2022-11-15 ENCOUNTER — Inpatient Hospital Stay: Payer: Medicare Other

## 2022-11-15 ENCOUNTER — Encounter: Payer: Self-pay | Admitting: Oncology

## 2022-11-15 ENCOUNTER — Inpatient Hospital Stay: Payer: Medicare Other | Attending: Oncology | Admitting: Oncology

## 2022-11-15 VITALS — BP 166/76 | HR 59 | Temp 97.9°F | Resp 18 | Ht 63.0 in | Wt 134.4 lb

## 2022-11-15 DIAGNOSIS — Z85820 Personal history of malignant melanoma of skin: Secondary | ICD-10-CM | POA: Diagnosis not present

## 2022-11-15 DIAGNOSIS — D472 Monoclonal gammopathy: Secondary | ICD-10-CM | POA: Diagnosis not present

## 2022-11-15 DIAGNOSIS — D696 Thrombocytopenia, unspecified: Secondary | ICD-10-CM | POA: Diagnosis not present

## 2022-11-15 DIAGNOSIS — N189 Chronic kidney disease, unspecified: Secondary | ICD-10-CM | POA: Insufficient documentation

## 2022-11-15 DIAGNOSIS — D631 Anemia in chronic kidney disease: Secondary | ICD-10-CM

## 2022-11-15 DIAGNOSIS — D649 Anemia, unspecified: Secondary | ICD-10-CM | POA: Diagnosis not present

## 2022-11-15 DIAGNOSIS — D510 Vitamin B12 deficiency anemia due to intrinsic factor deficiency: Secondary | ICD-10-CM

## 2022-11-15 DIAGNOSIS — N184 Chronic kidney disease, stage 4 (severe): Secondary | ICD-10-CM | POA: Diagnosis not present

## 2022-11-15 LAB — CBC WITH DIFFERENTIAL (CANCER CENTER ONLY)
Abs Immature Granulocytes: 0.01 10*3/uL (ref 0.00–0.07)
Basophils Absolute: 0.1 10*3/uL (ref 0.0–0.1)
Basophils Relative: 1 %
Eosinophils Absolute: 0.1 10*3/uL (ref 0.0–0.5)
Eosinophils Relative: 2 %
HCT: 33.1 % — ABNORMAL LOW (ref 36.0–46.0)
Hemoglobin: 10.6 g/dL — ABNORMAL LOW (ref 12.0–15.0)
Immature Granulocytes: 0 %
Lymphocytes Relative: 28 %
Lymphs Abs: 1.7 10*3/uL (ref 0.7–4.0)
MCH: 30.8 pg (ref 26.0–34.0)
MCHC: 32 g/dL (ref 30.0–36.0)
MCV: 96.2 fL (ref 80.0–100.0)
Monocytes Absolute: 0.4 10*3/uL (ref 0.1–1.0)
Monocytes Relative: 6 %
Neutro Abs: 3.9 10*3/uL (ref 1.7–7.7)
Neutrophils Relative %: 63 %
Platelet Count: 134 10*3/uL — ABNORMAL LOW (ref 150–400)
RBC: 3.44 MIL/uL — ABNORMAL LOW (ref 3.87–5.11)
RDW: 13.6 % (ref 11.5–15.5)
WBC Count: 6.2 10*3/uL (ref 4.0–10.5)
nRBC: 0 % (ref 0.0–0.2)

## 2022-11-15 LAB — CMP (CANCER CENTER ONLY)
ALT: 16 U/L (ref 0–44)
AST: 19 U/L (ref 15–41)
Albumin: 4.1 g/dL (ref 3.5–5.0)
Alkaline Phosphatase: 43 U/L (ref 38–126)
Anion gap: 8 (ref 5–15)
BUN: 60 mg/dL — ABNORMAL HIGH (ref 8–23)
CO2: 26 mmol/L (ref 22–32)
Calcium: 9.6 mg/dL (ref 8.9–10.3)
Chloride: 108 mmol/L (ref 98–111)
Creatinine: 2.51 mg/dL — ABNORMAL HIGH (ref 0.44–1.00)
GFR, Estimated: 19 mL/min — ABNORMAL LOW (ref >60)
Glucose, Bld: 210 mg/dL — ABNORMAL HIGH (ref 70–99)
Potassium: 4 mmol/L (ref 3.5–5.1)
Sodium: 142 mmol/L (ref 135–145)
Total Bilirubin: 0.4 mg/dL (ref 0.3–1.2)
Total Protein: 6.5 g/dL (ref 6.5–8.1)

## 2022-11-17 LAB — IGG, IGA, IGM
IgA: 108 mg/dL (ref 64–422)
IgG (Immunoglobin G), Serum: 835 mg/dL (ref 586–1602)
IgM (Immunoglobulin M), Srm: 39 mg/dL (ref 26–217)

## 2022-11-18 LAB — KAPPA/LAMBDA LIGHT CHAINS
Kappa free light chain: 59 mg/L — ABNORMAL HIGH (ref 3.3–19.4)
Kappa, lambda light chain ratio: 2.31 — ABNORMAL HIGH (ref 0.26–1.65)
Lambda free light chains: 25.5 mg/L (ref 5.7–26.3)

## 2022-11-19 LAB — PROTEIN ELECTROPHORESIS, SERUM
A/G Ratio: 1.6 (ref 0.7–1.7)
Albumin ELP: 3.8 g/dL (ref 2.9–4.4)
Alpha-1-Globulin: 0.1 g/dL (ref 0.0–0.4)
Alpha-2-Globulin: 0.7 g/dL (ref 0.4–1.0)
Beta Globulin: 0.8 g/dL (ref 0.7–1.3)
Gamma Globulin: 0.8 g/dL (ref 0.4–1.8)
Globulin, Total: 2.4 g/dL (ref 2.2–3.9)
Total Protein ELP: 6.2 g/dL (ref 6.0–8.5)

## 2022-11-22 ENCOUNTER — Telehealth: Payer: Self-pay

## 2022-11-22 NOTE — Telephone Encounter (Signed)
Patient notified and voiced understanding.

## 2022-11-22 NOTE — Telephone Encounter (Signed)
-----   Message from Belva Chimes, [name] sent at [id number]  4:15 PM EDT ----- Regarding: FW: call  ----- Message ----- From: Derwood Kaplan, MD Sent: 11/21/2022   7:07 PM EDT To: Belva Chimes, [name] Subject: call                                           Tell her labs look good, no monoclonal spike so won't need to see her anymore, just if needed

## 2022-12-16 ENCOUNTER — Telehealth: Payer: Self-pay

## 2022-12-16 ENCOUNTER — Other Ambulatory Visit: Payer: Self-pay | Admitting: Oncology

## 2022-12-16 DIAGNOSIS — D631 Anemia in chronic kidney disease: Secondary | ICD-10-CM

## 2022-12-16 DIAGNOSIS — D696 Thrombocytopenia, unspecified: Secondary | ICD-10-CM

## 2022-12-16 DIAGNOSIS — D472 Monoclonal gammopathy: Secondary | ICD-10-CM

## 2022-12-16 DIAGNOSIS — N189 Chronic kidney disease, unspecified: Secondary | ICD-10-CM

## 2022-12-16 HISTORY — DX: Chronic kidney disease, unspecified: N18.9

## 2022-12-16 HISTORY — DX: Thrombocytopenia, unspecified: D69.6

## 2022-12-16 HISTORY — DX: Anemia in chronic kidney disease: D63.1

## 2022-12-16 NOTE — Telephone Encounter (Signed)
-----   Message from Dellia Beckwith, MD sent at 12/16/2022 11:47 AM EDT ----- Regarding: call Tell her the final labs do show slight abnormal reading so I will rec she return in 1 year for repeat. Pls schedule appt and labs in 1 year and send copy of all labs to Dr. Jeanie Sewer

## 2022-12-16 NOTE — Telephone Encounter (Signed)
Patient notified of results.   Scheduling please call patient with appointment date and time for follow up visit.

## 2022-12-17 ENCOUNTER — Telehealth: Payer: Self-pay | Admitting: Oncology

## 2022-12-17 NOTE — Telephone Encounter (Signed)
Contacted pt to schedule an appt. Unable to reach via phone, voicemail was left.    Patient Calls Doctor, hospital First) View All Conversations on this Encounter [name], [name] routed conversation to [name], [place] hours ago (4:06 PM)   [name], [name]16 hours ago (4:06 PM)    Patient notified of results.    Scheduling please call patient with appointment date and time for follow up visit.      Note   [name], [name]  Alyssa Cooper, Alyssa Cooper [phone] hours ago (4:05 PM)   [name], [name] hours ago (4:04 PM)    ----- Message from Dellia Beckwith, MD sent at 12/16/2022 11:47 AM EDT ----- Regarding: call Tell her the final labs do show slight abnormal reading so I will rec she return in 1 year for repeat. Pls schedule appt and labs in 1 year and send copy of all labs to Dr. Jeanie Sewer

## 2022-12-19 ENCOUNTER — Telehealth: Payer: Self-pay | Admitting: Oncology

## 2022-12-19 NOTE — Telephone Encounter (Signed)
Patient has been scheduled. Aware of appt date and time   Patient Calls (Newest Message First) View All Conversations on this Encounter [name], [name] routed conversation to You; Davene Costain, Denise3 days ago   [name], [name]3 days ago    Patient notified of results.    Scheduling please call patient with appointment date and time for follow up visit.      Note   [name], [name]  [place], [place] [phone] days ago   [name], [name]3 days ago    ----- Message from Dellia Beckwith, MD sent at 12/16/2022 11:47 AM EDT ----- Regarding: call Tell her the final labs do show slight abnormal reading so I will rec she return in 1 year for repeat. Pls schedule appt and labs in 1 year and send copy of all labs to Dr. Jeanie Sewer          Note

## 2023-02-07 DIAGNOSIS — N189 Chronic kidney disease, unspecified: Secondary | ICD-10-CM | POA: Diagnosis not present

## 2023-02-07 DIAGNOSIS — I10 Essential (primary) hypertension: Secondary | ICD-10-CM | POA: Diagnosis not present

## 2023-02-07 DIAGNOSIS — E1129 Type 2 diabetes mellitus with other diabetic kidney complication: Secondary | ICD-10-CM | POA: Diagnosis not present

## 2023-02-19 DIAGNOSIS — I10 Essential (primary) hypertension: Secondary | ICD-10-CM | POA: Diagnosis not present

## 2023-02-19 DIAGNOSIS — E559 Vitamin D deficiency, unspecified: Secondary | ICD-10-CM | POA: Diagnosis not present

## 2023-02-19 DIAGNOSIS — E1129 Type 2 diabetes mellitus with other diabetic kidney complication: Secondary | ICD-10-CM | POA: Diagnosis not present

## 2023-02-19 LAB — LAB REPORT - SCANNED
A1c: 6.4
EGFR: 19

## 2023-03-14 HISTORY — DX: Hypercalcemia: E83.52

## 2023-03-17 DIAGNOSIS — N250 Renal osteodystrophy: Secondary | ICD-10-CM | POA: Diagnosis not present

## 2023-03-17 DIAGNOSIS — E1122 Type 2 diabetes mellitus with diabetic chronic kidney disease: Secondary | ICD-10-CM | POA: Diagnosis not present

## 2023-03-17 DIAGNOSIS — N184 Chronic kidney disease, stage 4 (severe): Secondary | ICD-10-CM | POA: Diagnosis not present

## 2023-03-24 DIAGNOSIS — E1122 Type 2 diabetes mellitus with diabetic chronic kidney disease: Secondary | ICD-10-CM | POA: Diagnosis not present

## 2023-03-24 DIAGNOSIS — N184 Chronic kidney disease, stage 4 (severe): Secondary | ICD-10-CM | POA: Diagnosis not present

## 2023-03-24 DIAGNOSIS — I10 Essential (primary) hypertension: Secondary | ICD-10-CM | POA: Diagnosis not present

## 2023-03-24 DIAGNOSIS — N250 Renal osteodystrophy: Secondary | ICD-10-CM | POA: Diagnosis not present

## 2023-03-31 DIAGNOSIS — L821 Other seborrheic keratosis: Secondary | ICD-10-CM | POA: Diagnosis not present

## 2023-03-31 DIAGNOSIS — Z85820 Personal history of malignant melanoma of skin: Secondary | ICD-10-CM | POA: Diagnosis not present

## 2023-03-31 DIAGNOSIS — L570 Actinic keratosis: Secondary | ICD-10-CM | POA: Diagnosis not present

## 2023-03-31 DIAGNOSIS — L578 Other skin changes due to chronic exposure to nonionizing radiation: Secondary | ICD-10-CM | POA: Diagnosis not present

## 2023-06-10 DIAGNOSIS — E119 Type 2 diabetes mellitus without complications: Secondary | ICD-10-CM | POA: Diagnosis not present

## 2023-06-11 ENCOUNTER — Encounter: Payer: Self-pay | Admitting: Cardiology

## 2023-06-11 ENCOUNTER — Ambulatory Visit: Payer: Medicare Other | Attending: Cardiology | Admitting: Cardiology

## 2023-06-11 VITALS — BP 136/62 | HR 62 | Ht 63.0 in | Wt 131.4 lb

## 2023-06-11 DIAGNOSIS — I2510 Atherosclerotic heart disease of native coronary artery without angina pectoris: Secondary | ICD-10-CM | POA: Diagnosis not present

## 2023-06-11 DIAGNOSIS — I10 Essential (primary) hypertension: Secondary | ICD-10-CM | POA: Diagnosis not present

## 2023-06-11 DIAGNOSIS — E088 Diabetes mellitus due to underlying condition with unspecified complications: Secondary | ICD-10-CM | POA: Diagnosis not present

## 2023-06-11 DIAGNOSIS — Z951 Presence of aortocoronary bypass graft: Secondary | ICD-10-CM

## 2023-06-11 MED ORDER — NITROGLYCERIN 0.4 MG SL SUBL: 0.4000 mg | SUBLINGUAL_TABLET | SUBLINGUAL | 7 refills | Status: AC | PRN

## 2023-06-11 NOTE — Progress Notes (Signed)
Cardiology Office Note:    Date:  06/11/2023   ID:  Alyssa Cooper, DOB [birth year], MRN [id number]  PCP:  Noni Saupe, MD  Cardiologist:  Garwin Brothers, MD   Referring MD: Noni Saupe, MD    ASSESSMENT:    1. Coronary artery disease involving native coronary artery of native heart without angina pectoris   2. Primary hypertension   3. Diabetes mellitus due to underlying condition with unspecified complications (HCC)   4. Hx of CABG    PLAN:    In order of problems listed above:  Coronary artery disease: Secondary prevention stressed with patient.  Importance of compliance with diet medication stressed and she vocalized understanding.  She exercises on a regular basis.  And I told her to walk at least half an hour a day 5 days a week and she promises to do so. Essential hypertension: Blood pressure is stable and diet was emphasized. Mixed dyslipidemia: On lipid-lowering medications followed by primary care.  Diet emphasized. Sublingual nitroglycerin prescription was sent, its protocol and 911 protocol explained and the patient vocalized understanding questions were answered to the patient's satisfaction Patient will be seen in follow-up appointment in 6 months or earlier if the patient has any concerns.    Medication Adjustments/Labs and Tests Ordered: Current medicines are reviewed at length with the patient today.  Concerns regarding medicines are outlined above.  Orders Placed This Encounter  Procedures   EKG 12-Lead   No orders of the defined types were placed in this encounter.    No chief complaint on file.    History of Present Illness:    Alyssa Cooper is a 82 y.o. female.  Patient has past medical history of coronary artery disease, essential hypertension, mixed dyslipidemia.  She denies any problems at this time and takes care of activities of daily living.  No chest pain orthopnea or PND.  At the time of my evaluation, the patient is  alert awake oriented and in no distress.  Past Medical History:  Diagnosis Date   Abnormal nuclear cardiac imaging test 06/11/2019   Abnormal stress test 07/22/2019   Anemia in chronic kidney disease 12/16/2022   Anemia, pernicious    CAD (coronary artery disease) 05/26/2019   CAD S/P percutaneous coronary angioplasty 07/23/2019   Chronic ischemic heart disease    Chronic kidney disease    Chronic kidney disease, stage 3 (moderate) 12/01/2018   Comprehensive diabetic foot examination, type 2 DM, encounter for (HCC) 12/23/2019   Diabetes mellitus due to underlying condition with unspecified complications (HCC) 05/26/2019   Diabetes mellitus without complication (HCC) 05/26/2019   Gammopathy, monoclonal 09/25/2016   Helicobacter heilmannii gastritis    History of melanoma    Hx of CABG 05/26/2019   Hypercalcemia 03/14/2023   Hypertension 12/01/2018   Moderate aortic stenosis 11/16/2021   Obesity    Osteoarthritis    Pain due to onychomycosis of toenail of right foot 01/13/2020   Peroneal tendonitis, left 12/23/2019   Preoperative cardiovascular examination 06/11/2019   Primary osteoarthritis of left foot 12/23/2019   Pure hypercholesterolemia    Renal osteodystrophy 12/01/2018   Thrombocytopenia (HCC) 12/16/2022   Type 2 diabetes mellitus with diabetic chronic kidney disease (HCC) 12/01/2018   Vitamin D deficiency     Past Surgical History:  Procedure Laterality Date   ABDOMINAL HYSTERECTOMY     BACK SURGERY  2011   BUNIONECTOMY     BYPASS GRAFT  2000   CATARACT EXTRACTION  CORONARY STENT INTERVENTION N/A 07/22/2019   Procedure: CORONARY STENT INTERVENTION;  Surgeon: Corky Crafts, MD;  Location: [facility] INVASIVE CV LAB;  Service: Cardiovascular;  Laterality: N/A;   LEFT HEART CATH AND CORS/GRAFTS ANGIOGRAPHY N/A 07/22/2019   Procedure: LEFT HEART CATH AND CORS/GRAFTS ANGIOGRAPHY;  Surgeon: Corky Crafts, MD;  Location: [facility] INVASIVE CV LAB;  Service:  Cardiovascular;  Laterality: N/A;   PARTIAL HYSTERECTOMY     REPLACEMENT TOTAL KNEE      Current Medications: Current Meds  Medication Sig   carvedilol (COREG) 25 MG tablet Take 25 mg by mouth 2 (two) times daily.   clopidogrel (PLAVIX) 75 MG tablet Take 75 mg by mouth daily.   COLLAGEN PO Take 1-2 tablets by mouth daily.   ergocalciferol (VITAMIN D2) 1.25 MG (50000 UT) capsule Take 50,000 Units by mouth 2 (two) times a week.   liraglutide (VICTOZA) 18 MG/3ML SOPN Inject 1.8 mg into the skin at bedtime.    nitroGLYCERIN (NITROSTAT) 0.4 MG SL tablet Place 1 tablet (0.4 mg total) under the tongue every 5 (five) minutes as needed for chest pain.   ramipril (ALTACE) 5 MG capsule Take 5 mg by mouth daily.   rosuvastatin (CRESTOR) 5 MG tablet Take 5 mg by mouth 2 (two) times a week.     Allergies:   Penicillins, Atorvastatin, Ezetimibe, Metformin, Motrin [ibuprofen], Nsaids, Pitavastatin, Tramadol, Bactrim [sulfamethoxazole-trimethoprim], and Furosemide   Social History   Socioeconomic History   Marital status: Widowed    Spouse name: Not on file   Number of children: 3   Years of education: Not on file   Highest education level: 10th grade  Occupational History   Not on file  Tobacco Use   Smoking status: Never   Smokeless tobacco: Never  Vaping Use   Vaping status: Never Used  Substance and Sexual Activity   Alcohol use: Never   Drug use: Never   Sexual activity: Not on file  Other Topics Concern   Not on file  Social History Narrative   Not on file   Social Determinants of Health   Financial Resource Strain: Not on file  Food Insecurity: Not on file  Transportation Needs: Not on file  Physical Activity: Not on file  Stress: Not on file  Social Connections: Not on file     Family History: The patient's family history includes Cancer in her son; Diabetes in her father and sister; Lung cancer in her brother.  ROS:   Please see the history of present illness.     All other systems reviewed and are negative.  EKGs/Labs/Other Studies Reviewed:    The following studies were reviewed today: .Marland KitchenEKG Interpretation Date/Time:  Wednesday June 11 2023 13:19:51 EDT Ventricular Rate:  62 PR Interval:  200 QRS Duration:  76 QT Interval:  384 QTC Calculation: 389 R Axis:   14  Text Interpretation: Normal sinus rhythm with sinus arrhythmia Low voltage QRS Cannot rule out Anterior infarct , age undetermined Abnormal ECG When compared with ECG of 23-Jul-2019 06:31, Premature atrial complexes are no longer Present Minimal criteria for Anterior infarct are now Present Confirmed by Belva Crome [phone]) on 06/11/2023 1:42:55 PM     Recent Labs: 11/15/2022: ALT 16; BUN 60; Creatinine 2.51; Hemoglobin 10.6; Platelet Count 134; Potassium 4.0; Sodium 142  Recent Lipid Panel    Component Value Date/Time   CHOL 149 05/15/2021 0908   TRIG 105 05/15/2021 0908   HDL 46 05/15/2021 0908   CHOLHDL 3.2 05/15/2021 0908  CHOLHDL 4.5 07/23/2019 0934   VLDL 27 07/23/2019 0934   LDLCALC 84 05/15/2021 0908    Physical Exam:    VS:  BP 136/62   Pulse 62   Ht 5\' 3"  (1.6 m)   Wt 131 lb 6.4 oz (59.6 kg)   SpO2 96%   BMI 23.28 kg/m     Wt Readings from Last 3 Encounters:  06/11/23 131 lb 6.4 oz (59.6 kg)  11/15/22 134 lb 6.4 oz (61 kg)  09/10/22 134 lb (60.8 kg)     GEN: Patient is in no acute distress HEENT: Normal NECK: No JVD; No carotid bruits LYMPHATICS: No lymphadenopathy CARDIAC: Hear sounds regular, 2/6 systolic murmur at the apex. RESPIRATORY:  Clear to auscultation without rales, wheezing or rhonchi  ABDOMEN: Soft, non-tender, non-distended MUSCULOSKELETAL:  No edema; No deformity  SKIN: Warm and dry NEUROLOGIC:  Alert and oriented x 3 PSYCHIATRIC:  Normal affect   Signed, Garwin Brothers, MD  06/11/2023 1:44 PM    [facility]

## 2023-06-11 NOTE — Addendum Note (Signed)
Addended by: Eleonore Chiquito on: 06/11/2023 01:56 PM   Modules accepted: Orders

## 2023-06-11 NOTE — Patient Instructions (Signed)

## 2023-06-18 DIAGNOSIS — I10 Essential (primary) hypertension: Secondary | ICD-10-CM | POA: Diagnosis not present

## 2023-06-18 DIAGNOSIS — N184 Chronic kidney disease, stage 4 (severe): Secondary | ICD-10-CM | POA: Diagnosis not present

## 2023-06-18 DIAGNOSIS — Z0000 Encounter for general adult medical examination without abnormal findings: Secondary | ICD-10-CM | POA: Diagnosis not present

## 2023-06-18 DIAGNOSIS — E7800 Pure hypercholesterolemia, unspecified: Secondary | ICD-10-CM | POA: Diagnosis not present

## 2023-06-18 DIAGNOSIS — E1129 Type 2 diabetes mellitus with other diabetic kidney complication: Secondary | ICD-10-CM | POA: Diagnosis not present

## 2023-08-13 DIAGNOSIS — Z6823 Body mass index (BMI) 23.0-23.9, adult: Secondary | ICD-10-CM | POA: Diagnosis not present

## 2023-08-13 DIAGNOSIS — M5432 Sciatica, left side: Secondary | ICD-10-CM | POA: Diagnosis not present

## 2023-08-20 DIAGNOSIS — Z6822 Body mass index (BMI) 22.0-22.9, adult: Secondary | ICD-10-CM | POA: Diagnosis not present

## 2023-08-20 DIAGNOSIS — M545 Low back pain, unspecified: Secondary | ICD-10-CM | POA: Diagnosis not present

## 2023-08-20 DIAGNOSIS — M533 Sacrococcygeal disorders, not elsewhere classified: Secondary | ICD-10-CM | POA: Diagnosis not present

## 2023-08-21 DIAGNOSIS — M533 Sacrococcygeal disorders, not elsewhere classified: Secondary | ICD-10-CM | POA: Diagnosis not present

## 2023-09-05 DIAGNOSIS — M533 Sacrococcygeal disorders, not elsewhere classified: Secondary | ICD-10-CM | POA: Diagnosis not present

## 2023-09-05 DIAGNOSIS — Z6822 Body mass index (BMI) 22.0-22.9, adult: Secondary | ICD-10-CM | POA: Diagnosis not present

## 2023-09-30 DIAGNOSIS — E1122 Type 2 diabetes mellitus with diabetic chronic kidney disease: Secondary | ICD-10-CM | POA: Diagnosis not present

## 2023-09-30 DIAGNOSIS — N184 Chronic kidney disease, stage 4 (severe): Secondary | ICD-10-CM | POA: Diagnosis not present

## 2023-10-06 DIAGNOSIS — N250 Renal osteodystrophy: Secondary | ICD-10-CM | POA: Diagnosis not present

## 2023-10-06 DIAGNOSIS — I10 Essential (primary) hypertension: Secondary | ICD-10-CM | POA: Diagnosis not present

## 2023-10-06 DIAGNOSIS — N184 Chronic kidney disease, stage 4 (severe): Secondary | ICD-10-CM | POA: Diagnosis not present

## 2023-11-03 ENCOUNTER — Telehealth: Payer: Self-pay | Admitting: Pharmacist

## 2023-11-03 DIAGNOSIS — L820 Inflamed seborrheic keratosis: Secondary | ICD-10-CM | POA: Diagnosis not present

## 2023-11-03 DIAGNOSIS — L821 Other seborrheic keratosis: Secondary | ICD-10-CM | POA: Diagnosis not present

## 2023-11-03 DIAGNOSIS — L570 Actinic keratosis: Secondary | ICD-10-CM | POA: Diagnosis not present

## 2023-11-03 DIAGNOSIS — Z85820 Personal history of malignant melanoma of skin: Secondary | ICD-10-CM | POA: Diagnosis not present

## 2023-11-03 NOTE — Progress Notes (Signed)
 Attempted to contact patient for medication access re: Victoza. Left HIPAA compliant message for patient to return my call at their convenience.   Reynold Bowen, PharmD Clinical Pharmacist [place] Direct Dial: [phone]

## 2023-11-18 ENCOUNTER — Inpatient Hospital Stay: Payer: Medicare Other

## 2023-11-18 ENCOUNTER — Encounter: Payer: Self-pay | Admitting: Oncology

## 2023-11-18 ENCOUNTER — Inpatient Hospital Stay: Payer: Medicare Other | Attending: Oncology | Admitting: Oncology

## 2023-11-18 VITALS — BP 184/86 | HR 59 | Temp 97.5°F | Resp 20 | Ht 63.0 in | Wt 121.1 lb

## 2023-11-18 DIAGNOSIS — R7989 Other specified abnormal findings of blood chemistry: Secondary | ICD-10-CM | POA: Insufficient documentation

## 2023-11-18 DIAGNOSIS — N261 Atrophy of kidney (terminal): Secondary | ICD-10-CM | POA: Insufficient documentation

## 2023-11-18 DIAGNOSIS — D631 Anemia in chronic kidney disease: Secondary | ICD-10-CM | POA: Diagnosis not present

## 2023-11-18 DIAGNOSIS — D472 Monoclonal gammopathy: Secondary | ICD-10-CM

## 2023-11-18 DIAGNOSIS — D649 Anemia, unspecified: Secondary | ICD-10-CM | POA: Diagnosis not present

## 2023-11-18 DIAGNOSIS — D696 Thrombocytopenia, unspecified: Secondary | ICD-10-CM

## 2023-11-18 DIAGNOSIS — N189 Chronic kidney disease, unspecified: Secondary | ICD-10-CM | POA: Insufficient documentation

## 2023-11-18 DIAGNOSIS — N184 Chronic kidney disease, stage 4 (severe): Secondary | ICD-10-CM

## 2023-11-18 LAB — CMP (CANCER CENTER ONLY)
ALT: 18 U/L (ref 0–44)
AST: 22 U/L (ref 15–41)
Albumin: 3.9 g/dL (ref 3.5–5.0)
Alkaline Phosphatase: 79 U/L (ref 38–126)
Anion gap: 11 (ref 5–15)
BUN: 45 mg/dL — ABNORMAL HIGH (ref 8–23)
CO2: 25 mmol/L (ref 22–32)
Calcium: 10.6 mg/dL — ABNORMAL HIGH (ref 8.9–10.3)
Chloride: 107 mmol/L (ref 98–111)
Creatinine: 1.99 mg/dL — ABNORMAL HIGH (ref 0.44–1.00)
GFR, Estimated: 24 mL/min — ABNORMAL LOW (ref >60)
Glucose, Bld: 160 mg/dL — ABNORMAL HIGH (ref 70–99)
Potassium: 4.2 mmol/L (ref 3.5–5.1)
Sodium: 143 mmol/L (ref 135–145)
Total Bilirubin: 0.3 mg/dL (ref 0.0–1.2)
Total Protein: 6.4 g/dL — ABNORMAL LOW (ref 6.5–8.1)

## 2023-11-18 LAB — CBC WITH DIFFERENTIAL (CANCER CENTER ONLY)
Abs Immature Granulocytes: 0.02 10*3/uL (ref 0.00–0.07)
Basophils Absolute: 0.1 10*3/uL (ref 0.0–0.1)
Basophils Relative: 1 %
Eosinophils Absolute: 0.4 10*3/uL (ref 0.0–0.5)
Eosinophils Relative: 5 %
HCT: 33.1 % — ABNORMAL LOW (ref 36.0–46.0)
Hemoglobin: 11.1 g/dL — ABNORMAL LOW (ref 12.0–15.0)
Immature Granulocytes: 0 %
Lymphocytes Relative: 16 %
Lymphs Abs: 1.4 10*3/uL (ref 0.7–4.0)
MCH: 31.9 pg (ref 26.0–34.0)
MCHC: 33.5 g/dL (ref 30.0–36.0)
MCV: 95.1 fL (ref 80.0–100.0)
Monocytes Absolute: 0.6 10*3/uL (ref 0.1–1.0)
Monocytes Relative: 7 %
Neutro Abs: 5.9 10*3/uL (ref 1.7–7.7)
Neutrophils Relative %: 71 %
Platelet Count: 163 10*3/uL (ref 150–400)
RBC: 3.48 MIL/uL — ABNORMAL LOW (ref 3.87–5.11)
RDW: 13.5 % (ref 11.5–15.5)
WBC Count: 8.3 10*3/uL (ref 4.0–10.5)
nRBC: 0 % (ref 0.0–0.2)
nRBC: 0 /100{WBCs}

## 2023-11-18 LAB — FOLATE: Folate: 13.5 ng/mL (ref >5.9)

## 2023-11-18 LAB — VITAMIN B12: Vitamin B-12: 264 pg/mL (ref 180–914)

## 2023-11-18 LAB — FERRITIN: Ferritin: 57 ng/mL (ref 11–307)

## 2023-11-18 LAB — IRON AND TIBC
Iron: 58 ug/dL (ref 28–170)
Saturation Ratios: 19 % (ref 10.4–31.8)
TIBC: 307 ug/dL (ref 250–450)
UIBC: 249 ug/dL

## 2023-11-18 NOTE — Progress Notes (Addendum)
 ADDENDUM: I have reviewed the patient's labs now and she does have recurrence of a monoclonal spike of 0.2.  She has normal levels of immunoglobulins and just slight elevation of the serum light chains.  Her vitamin levels are normal.  In view of the persistence of an M spike, I will recommend that I see her back in 1 year with repeat labs.   [facility]  [address] [place],  [place]  [address] [phone]  Clinic Day: 11/18/2023  Referring physician: Noni Saupe, MD  CHIEF COMPLAINT:  CC: Benign monoclonal gammopathy   Current Treatment:  Surveillance  HISTORY OF PRESENT ILLNESS:  Alyssa Cooper is a 83 y.o. female with a history of a tiny monoclonal spike of IgG lambda.  Her quantitative immunoglobulins were normal and a urine protein electrophoresis was negative.  She does have chronic kidney disease and her creatinine was 1.3 at that time.  The monoclonal gammopathy was detected by her nephrologist.  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  We recommended that we simply monitor this.  When she was seen in 2019, the M spike was still quite low at 0.2 g per dL, and her quantitative immunoglobulins were normal.  Her husband expired on June 28, 2018.  Repeat labs in February of 2020 revealed a stable M-spike of 0.2.  In February of 2022, the M spike was undetectable but in July of 2022, it was 0.1. It was undetectable again in 2023.  INTERVAL HISTORY:  Alyssa Cooper is here for routine follow up for her benign monoclonal gammopathy. Patient states that she feels well but complains of hair loss and left shoulder pain. She has a WBC of 8.3, low hemoglobin of 11.1 down from 11.4, and platelet count of 163,000. She has a elevated creatinine of 1.99 with a BUN of 45, calcium of 10.6, and glucose of 160. The rest of her CMP is normal. Her iron studies, folate, ferritin, B-12, SPEP, serum light chains, and quantitative immunoglobulins are pending. I will contact her with her  lab results.  If she has return of a M-spike, I will schedule a follow-up in 1 year. Since it has been nearly 3 years since she has had a M-spike, so I informed her that she can be seen as needed. I will request that her PCP perform a annual SPEP for monitoring. She denies signs of infection such as sore throat, sinus drainage, cough, or urinary symptoms.  She denies fevers or recurrent chills. She denies pain. She denies nausea, vomiting, chest pain, dyspnea or cough. Her appetite is on and off and her weight has decreased 10 pounds over last 5 months .    REVIEW OF SYSTEMS:  Review of Systems  Constitutional:  Positive for appetite change. Negative for chills, diaphoresis, fatigue, fever and unexpected weight change.  HENT:  Negative.  Negative for hearing loss, lump/mass, mouth sores, nosebleeds, sore throat, tinnitus, trouble swallowing and voice change.   Eyes: Negative.  Negative for eye problems and icterus.  Respiratory: Negative.  Negative for chest tightness, cough, hemoptysis, shortness of breath and wheezing.   Cardiovascular: Negative.  Negative for chest pain, leg swelling and palpitations.  Gastrointestinal: Negative.  Negative for abdominal distention, abdominal pain, blood in stool, constipation, diarrhea, nausea, rectal pain and vomiting.  Endocrine: Negative.   Genitourinary: Negative.  Negative for bladder incontinence, difficulty urinating, dyspareunia, dysuria, frequency, hematuria, menstrual problem, nocturia, pelvic pain, vaginal bleeding and vaginal discharge.   Musculoskeletal:  Positive for arthralgias (especially of the bilateral shoulders, L>R). Negative for back pain, flank pain, gait problem, myalgias, neck pain and neck stiffness.  Skin: Negative.  Negative for itching, rash and wound.  Neurological: Negative.  Negative for dizziness, extremity weakness, gait problem, headaches, light-headedness, numbness, seizures and speech difficulty.  Hematological: Negative.   Negative for adenopathy. Does not bruise/bleed easily.  Psychiatric/Behavioral: Negative.  Negative for confusion, decreased concentration, depression, sleep disturbance and suicidal ideas. The patient is not nervous/anxious.      VITALS:  Blood pressure (!) 184/86, pulse (!) 59, temperature (!) 97.5 F (36.4 C), temperature source Oral, resp. rate 20, height 5\' 3"  (1.6 m), weight 121 lb 1.6 oz (54.9 kg), SpO2 99%.  Wt Readings from Last 3 Encounters:  11/18/23 121 lb 1.6 oz (54.9 kg)  06/11/23 131 lb 6.4 oz (59.6 kg)  11/15/22 134 lb 6.4 oz (61 kg)    Body mass index is 21.45 kg/m.  Performance status (ECOG): 1 - Symptomatic but completely ambulatory  PHYSICAL EXAM:  Physical Exam Vitals and nursing note reviewed.  Constitutional:      General: She is not in acute distress.    Appearance: Normal appearance. She is normal weight. She is not ill-appearing, toxic-appearing or diaphoretic.  HENT:     Head: Normocephalic and atraumatic.     Right Ear: Tympanic membrane, ear canal and external ear normal. There is no impacted cerumen.     Left Ear: Tympanic membrane, ear canal and external ear normal. There is no impacted cerumen.     Nose: Nose normal. No congestion or rhinorrhea.     Mouth/Throat:     Mouth: Mucous membranes are moist.     Pharynx: Oropharynx is clear. No oropharyngeal exudate or posterior oropharyngeal erythema.  Eyes:     General: No scleral icterus.       Right eye: No discharge.        Left eye: No discharge.     Extraocular Movements: Extraocular movements intact.     Conjunctiva/sclera: Conjunctivae normal.     Pupils: Pupils are equal, round, and reactive to light.  Neck:     Vascular: No carotid bruit.  Cardiovascular:     Rate and Rhythm: Normal rate and regular rhythm.     Pulses: Normal pulses.     Heart sounds: Murmur heard.     Systolic murmur is present with a grade of 1/6.     No friction rub. No gallop.  Pulmonary:     Effort: Pulmonary  effort is normal. No respiratory distress.     Breath sounds: Normal breath sounds. No stridor. No wheezing, rhonchi or rales.  Chest:     Chest wall: No tenderness.  Abdominal:     General: Bowel sounds are normal. There is no distension.     Palpations: Abdomen is soft. There is no hepatomegaly, splenomegaly or mass.     Tenderness: There is no abdominal tenderness. There is no right CVA tenderness, left CVA tenderness, guarding or rebound.     Hernia: No hernia is present.  Musculoskeletal:        General: No swelling, tenderness, deformity or signs of injury. Normal range of motion.     Cervical back: Normal range of motion and neck supple. No rigidity or tenderness.     Right lower leg: No edema.     Left lower leg: No edema.     Comments: Right shoulder is prominent  Lymphadenopathy:     Cervical: No  cervical adenopathy.  Skin:    General: Skin is warm and dry.     Coloration: Skin is not jaundiced or pale.     Findings: No bruising, erythema, lesion or rash.  Neurological:     General: No focal deficit present.     Mental Status: She is alert and oriented to person, place, and time. Mental status is at baseline.     Cranial Nerves: No cranial nerve deficit.     Sensory: No sensory deficit.     Motor: No weakness.     Coordination: Coordination normal.     Gait: Gait normal.     Deep Tendon Reflexes: Reflexes normal.  Psychiatric:        Mood and Affect: Mood normal.        Behavior: Behavior normal.        Thought Content: Thought content normal.        Judgment: Judgment normal.     LABS:      Latest Ref Rng & Units 11/18/2023    8:36 AM 11/15/2022    9:23 AM 11/14/2021   12:00 AM  CBC  WBC 4.0 - 10.5 K/uL 8.3  6.2  7.0   Hemoglobin 12.0 - 15.0 g/dL 18.8  41.6  60.6   Hematocrit 36.0 - 46.0 % 33.1  33.1  34   Platelets 150 - 400 K/uL 163  134  126       Latest Ref Rng & Units 11/18/2023    8:36 AM 11/15/2022    9:23 AM 11/14/2021   12:00 AM  CMP  Glucose 70 - 99  mg/dL 301  601    BUN 8 - 23 mg/dL 45  60  40   Creatinine 0.44 - 1.00 mg/dL 0.93  2.35  2.7   Sodium 135 - 145 mmol/L 143  142  141   Potassium 3.5 - 5.1 mmol/L 4.2  4.0  4.0   Chloride 98 - 111 mmol/L 107  108  108   CO2 22 - 32 mmol/L 25  26  27    Calcium 8.9 - 10.3 mg/dL 57.3  9.6  22.0   Total Protein 6.5 - 8.1 g/dL 6.4  6.5    Total Bilirubin 0.0 - 1.2 mg/dL 0.3  0.4    Alkaline Phos 38 - 126 U/L 79  43  53   AST 15 - 41 U/L 22  19  33   ALT 0 - 44 U/L 18  16  21      Lab Results  Component Value Date   TOTALPROTELP 6.0 11/18/2023   ALBUMINELP 3.3 11/18/2023   A1GS 0.2 11/18/2023   A2GS 0.9 11/18/2023   BETS 1.0 11/18/2023   GAMS 0.6 11/18/2023   MSPIKE 0.2 (H) 11/18/2023   SPEI Comment 11/18/2023    STUDIES:  No results found.    HISTORY:   Allergies:  Allergies  Allergen Reactions   Penicillins Hives    Did it involve swelling of the face/tongue/throat, SOB, or low BP? No  Did it involve sudden or severe rash/hives, skin peeling, or any reaction on the inside of your mouth or nose? Yes  Did you need to seek medical attention at a hospital or doctor's office? Yes  When did it last happen?     in her 30s?   If all above answers are "NO", may proceed with cephalosporin use.  Did it involve swelling of the face/tongue/throat, SOB, or low BP? No, Did it involve sudden or severe  rash/hives, skin peeling, or any reaction on the inside of your mouth or nose? Yes, Did you need to seek medical attention at a hospital or doctor's office? Yes, When did it last happen?     in her 30s? , If all above answers are "NO", may proceed with cephalosporin use.   Atorvastatin Other (See Comments)    Joint pain   Ezetimibe Other (See Comments)    Muscle aches     Metformin Other (See Comments)   Motrin [Ibuprofen]    Nsaids    Pitavastatin Other (See Comments)   Tramadol Other (See Comments)   Bactrim [Sulfamethoxazole-Trimethoprim] Rash   Furosemide Rash    Current  Medications: Current Outpatient Medications  Medication Sig Dispense Refill   ramipril (ALTACE) 10 MG capsule Take by mouth.     carvedilol (COREG) 25 MG tablet Take 25 mg by mouth 2 (two) times daily.     clopidogrel (PLAVIX) 75 MG tablet Take 75 mg by mouth daily.     ergocalciferol (VITAMIN D2) 1.25 MG (50000 UT) capsule Take 50,000 Units by mouth 2 (two) times a week.     liraglutide (VICTOZA) 18 MG/3ML SOPN Inject 1.8 mg into the skin at bedtime.      nitroGLYCERIN (NITROSTAT) 0.4 MG SL tablet Place 1 tablet (0.4 mg total) under the tongue every 5 (five) minutes as needed for chest pain. 25 tablet 7   rosuvastatin (CRESTOR) 5 MG tablet Take 5 mg by mouth 2 (two) times a week.     No current facility-administered medications for this visit.     ASSESSMENT & PLAN:  Assessment:   Benign monoclonal gammopathy, last measuring 0.1 in July 2022, often undetectable. Today's results are pending.  Anemia, new. We will evaluate for nutritional deficiency with iron studies, B12 and folate.   Chronic kidney disease, worsening. This may be contributing to her worsening anemia. She is being followed by nephrology.  Plan: She has a WBC of 8.3, low hemoglobin of 11.1 down from 11.4, and platelet count of 163,000. She has a elevated creatinine of 1.99 with a BUN of 45, calcium of 10.6, and glucose of 160. The rest of her CMP is normal. Her iron studies, folate, ferritin, B-12, SPEP, serum light chains, and quantitative immunoglobulins are pending. I will contact her with her lab results.  If she has return of a M-spike, I will schedule a follow-up in 1 year. Since it has been over 3 years since she has had a M-spike, so I informed her that she can be seen as needed. I will request that her PCP perform a annual SPEP for monitoring. The patient understands the plans discussed today and is in agreement with them.  She knows to contact our office if she develops concerns prior to her next appointment.  I  provided 14 minutes of face-to-face time during this this encounter and > 50% was spent counseling as documented under my assessment and plan.   Dellia Beckwith, MD [facility] - A DEPT OF [facility] [address] [place] [address] Dept: [phone] Dept Fax: [phone]   No orders of the defined types were placed in this encounter.   I,Jasmine M Lassiter,acting as a scribe for Dellia Beckwith, MD.,have documented all relevant documentation on the behalf of Dellia Beckwith, MD,as directed by  Dellia Beckwith, MD while in the presence of Dellia Beckwith, MD.

## 2023-11-19 LAB — PROTEIN ELECTROPHORESIS, SERUM
A/G Ratio: 1.2 (ref 0.7–1.7)
Albumin ELP: 3.3 g/dL (ref 2.9–4.4)
Alpha-1-Globulin: 0.2 g/dL (ref 0.0–0.4)
Alpha-2-Globulin: 0.9 g/dL (ref 0.4–1.0)
Beta Globulin: 1 g/dL (ref 0.7–1.3)
Gamma Globulin: 0.6 g/dL (ref 0.4–1.8)
Globulin, Total: 2.7 g/dL (ref 2.2–3.9)
M-Spike, %: 0.2 g/dL — ABNORMAL HIGH
Total Protein ELP: 6 g/dL (ref 6.0–8.5)

## 2023-11-19 LAB — KAPPA/LAMBDA LIGHT CHAINS
Kappa free light chain: 35.6 mg/L — ABNORMAL HIGH (ref 3.3–19.4)
Kappa, lambda light chain ratio: 1.51 (ref 0.26–1.65)
Lambda free light chains: 23.6 mg/L (ref 5.7–26.3)

## 2023-11-19 LAB — IGG, IGA, IGM
IgA: 109 mg/dL (ref 64–422)
IgG (Immunoglobin G), Serum: 675 mg/dL (ref 586–1602)
IgM (Immunoglobulin M), Srm: 46 mg/dL (ref 26–217)

## 2023-11-27 ENCOUNTER — Other Ambulatory Visit: Payer: Self-pay | Admitting: Oncology

## 2023-11-27 DIAGNOSIS — D472 Monoclonal gammopathy: Secondary | ICD-10-CM

## 2023-12-01 ENCOUNTER — Telehealth: Payer: Self-pay | Admitting: Oncology

## 2023-12-01 NOTE — Telephone Encounter (Signed)
 12/01/23 Spoke with patient and confirmed next appts.

## 2023-12-02 ENCOUNTER — Telehealth: Payer: Self-pay | Admitting: Pharmacist

## 2023-12-02 NOTE — Progress Notes (Signed)
   12/02/2023  Patient ID: Alyssa Cooper, female   DOB: 1941-08-21, 83 y.o.   MRN: [id number]  Telephonic engagement with patient today.  Advised patient that [employer] no longer offers Victoza on the patient assistance program. Patient declines to change medications at this time to another GLP1. Advised patient that she can make an appointment at her convenience to discuss medication options with Dr. Ralene Cork. Patient is agreeable but firm that she does not want to change medications.  Reynold Bowen, PharmD Clinical Pharmacist [place] Direct Dial: [phone]

## 2023-12-03 ENCOUNTER — Telehealth: Payer: Self-pay | Admitting: Pharmacist

## 2023-12-03 ENCOUNTER — Telehealth: Payer: Self-pay

## 2023-12-03 ENCOUNTER — Other Ambulatory Visit (HOSPITAL_COMMUNITY): Payer: Self-pay

## 2023-12-03 DIAGNOSIS — Z6821 Body mass index (BMI) 21.0-21.9, adult: Secondary | ICD-10-CM | POA: Diagnosis not present

## 2023-12-03 DIAGNOSIS — E1165 Type 2 diabetes mellitus with hyperglycemia: Secondary | ICD-10-CM | POA: Diagnosis not present

## 2023-12-03 DIAGNOSIS — J309 Allergic rhinitis, unspecified: Secondary | ICD-10-CM | POA: Diagnosis not present

## 2023-12-03 DIAGNOSIS — E11319 Type 2 diabetes mellitus with unspecified diabetic retinopathy without macular edema: Secondary | ICD-10-CM | POA: Diagnosis not present

## 2023-12-03 LAB — LAB REPORT - SCANNED
A1c: 6.6
Albumin, Urine POC: 45.1
Creatinine, POC: 104 mg/dL
EGFR: 6.6

## 2023-12-03 NOTE — Telephone Encounter (Signed)
 Completed patient assistance application for Ozempic [employer]) application and emailed to [place] to have pt. And provider sign at office tomorrow

## 2023-12-03 NOTE — Progress Notes (Signed)
   12/03/2023  Patient ID: Alyssa Cooper, female   DOB: 08/31/1941, 83 y.o.   MRN: [id number]  Telephonic engagement with Alyssa Cooper regarding changing from Victoza to Ozempic. Patient had office visit with PCP today and is now open to changing GLP1 to Ozempic once weekly through [employer] patient assistance program. Patient will come to the office tomorrow to sign her paper work. Will coordinate with the [facility] patient advocate to assist with coordination and follow up. Will provide patient with sample of Ozempic while waiting for PAP.   Reynold Bowen, PharmD Clinical Pharmacist [place] Direct Dial: [phone]

## 2023-12-03 NOTE — Telephone Encounter (Signed)
 Pharmacy Patient Advocate Encounter  Insurance verification completed.   The patient is insured through [employer] test claim for Victoza. Currently a quantity of 9 is a 30 day supply and the co-pay is not covered .   This test claim was processed through [facility]- copay amounts may vary at other pharmacies due to pharmacy/plan contracts, or as the patient moves through the different stages of their insurance plan.

## 2023-12-04 DIAGNOSIS — E1165 Type 2 diabetes mellitus with hyperglycemia: Secondary | ICD-10-CM | POA: Diagnosis not present

## 2023-12-08 ENCOUNTER — Telehealth: Payer: Self-pay | Admitting: Pharmacist

## 2023-12-08 NOTE — Progress Notes (Signed)
 Attempted to contact patient for medication management. Left HIPAA compliant message for patient to return my call at their convenience.    Reynold Bowen, PharmD Clinical Pharmacist [place] Direct Dial: [phone]

## 2023-12-11 ENCOUNTER — Encounter: Payer: Self-pay | Admitting: Pharmacist

## 2023-12-11 NOTE — Progress Notes (Signed)
   12/11/2023  Patient ID: Alyssa Cooper, female   DOB: 10-Jun-1941, 83 y.o.   MRN: [id number]  Updated medication list in home EMR and completed patient assistance application for coordination by [facility] advocate team.   Reynold Bowen, PharmD Clinical Pharmacist [place] Direct Dial: [phone]

## 2023-12-11 NOTE — Telephone Encounter (Signed)
 PAP: Application for Ozempic has been submitted to [employer], via fax

## 2023-12-15 NOTE — Progress Notes (Signed)
 Pharmacy Medication Assistance Program Note    12/15/2023  Patient ID: Alyssa Cooper, female   DOB: 12-01-1940, 83 y.o.   MRN: [id number]     12/03/2023  Outreach Medication One  Initial Outreach Date (Medication One) 12/03/2023  Manufacturer Medication One [employer] Ozempic  Dose of Ozempic 0.5mg   Type of Radiographer, therapeutic Assistance  Date Application Sent to Patient 12/03/2023  Application Items Requested Application;Proof of Income  Date Application Sent to Prescriber 12/03/2023  Name of Prescriber Chadwic Nabors  Date Application Received From Patient 12/11/2023  Application Items Received From Patient Application  Date Application Received From Provider 12/11/2023  Date Application Submitted to Manufacturer 12/11/2023  Method Application Sent to Manufacturer Fax  Patient Assistance Determination Approved  Approval Start Date 12/12/2023  Approval End Date 09/08/2024  Patient Notification Method Telephone Call     Signature

## 2023-12-15 NOTE — Telephone Encounter (Signed)
 PAP: Patient assistance application for Ozempic has been approved by PAP Companies: [employer] from 12/12/2023 to 09/08/2024. Medication should be delivered to PAP Delivery: Provider's office. For further shipping updates, please [employer] at [phone]. Patient ID is: [id number]

## 2023-12-31 DIAGNOSIS — E1165 Type 2 diabetes mellitus with hyperglycemia: Secondary | ICD-10-CM | POA: Diagnosis not present

## 2024-01-21 DIAGNOSIS — L03116 Cellulitis of left lower limb: Secondary | ICD-10-CM | POA: Diagnosis not present

## 2024-01-21 DIAGNOSIS — S81802A Unspecified open wound, left lower leg, initial encounter: Secondary | ICD-10-CM | POA: Diagnosis not present

## 2024-01-21 DIAGNOSIS — Z6821 Body mass index (BMI) 21.0-21.9, adult: Secondary | ICD-10-CM | POA: Diagnosis not present

## 2024-01-21 DIAGNOSIS — S81801A Unspecified open wound, right lower leg, initial encounter: Secondary | ICD-10-CM | POA: Diagnosis not present

## 2024-01-28 DIAGNOSIS — S81801A Unspecified open wound, right lower leg, initial encounter: Secondary | ICD-10-CM | POA: Diagnosis not present

## 2024-01-28 DIAGNOSIS — L03116 Cellulitis of left lower limb: Secondary | ICD-10-CM | POA: Diagnosis not present

## 2024-01-28 DIAGNOSIS — Z6821 Body mass index (BMI) 21.0-21.9, adult: Secondary | ICD-10-CM | POA: Diagnosis not present

## 2024-01-28 DIAGNOSIS — S81802A Unspecified open wound, left lower leg, initial encounter: Secondary | ICD-10-CM | POA: Diagnosis not present

## 2024-03-03 ENCOUNTER — Other Ambulatory Visit: Payer: Self-pay

## 2024-03-03 DIAGNOSIS — N184 Chronic kidney disease, stage 4 (severe): Secondary | ICD-10-CM | POA: Diagnosis not present

## 2024-03-04 ENCOUNTER — Ambulatory Visit: Attending: Cardiology | Admitting: Cardiology

## 2024-03-04 ENCOUNTER — Encounter: Payer: Self-pay | Admitting: Cardiology

## 2024-03-04 VITALS — BP 116/60 | HR 62 | Ht 63.0 in | Wt 120.6 lb

## 2024-03-04 DIAGNOSIS — I10 Essential (primary) hypertension: Secondary | ICD-10-CM

## 2024-03-04 DIAGNOSIS — I2510 Atherosclerotic heart disease of native coronary artery without angina pectoris: Secondary | ICD-10-CM | POA: Diagnosis not present

## 2024-03-04 DIAGNOSIS — E1122 Type 2 diabetes mellitus with diabetic chronic kidney disease: Secondary | ICD-10-CM

## 2024-03-04 DIAGNOSIS — E7800 Pure hypercholesterolemia, unspecified: Secondary | ICD-10-CM

## 2024-03-04 DIAGNOSIS — Z951 Presence of aortocoronary bypass graft: Secondary | ICD-10-CM

## 2024-03-04 NOTE — Progress Notes (Signed)
 Cardiology Office Note:    Date:  03/04/2024   ID:  Alyssa Cooper [place], DOB 1941-08-22, MRN [id number]  PCP:  Alyssa Dene BROCKS, DO  Cardiologist:  Alyssa JONELLE Crape, MD   Referring MD: Alyssa Dene BROCKS, DO    ASSESSMENT:    1. Primary hypertension   2. Coronary artery disease involving native coronary artery of native heart without angina pectoris   3. Type 2 diabetes mellitus with chronic kidney disease, without long-term current use of insulin , unspecified CKD stage (HCC)   4. Hx of CABG   5. Pure hypercholesterolemia    PLAN:    In order of problems listed above:  Coronary artery disease: Secondary prevention stressed with the patient.  Importance of compliance with diet and medications stressed and she vocalized understanding.  She was advised to walk at least half an hour a day on a daily basis. Essential hypertension: Blood pressure stable and diet was emphasized. Mixed dyslipidemia: On lipid-lowering medications followed by primary care. Renal insufficiency: This is advanced and she is aware of it.  She is following a primary care for this. Patient will be seen in follow-up appointment in 6 months or earlier if the patient has any concerns.    Medication Adjustments/Labs and Tests Ordered: Current medicines are reviewed at length with the patient today.  Concerns regarding medicines are outlined above.  No orders of the defined types were placed in this encounter.  No orders of the defined types were placed in this encounter.    No chief complaint on file.    History of Present Illness:    Alyssa Cooper is a 83 y.o. female.  Patient has past medical history of coronary artery disease post CABG surgery, essential hypertension, mixed dyslipidemia and advanced renal insufficiency.  She denies any problems at this time and takes care of activities of daily living.  No chest pain orthopnea or PND.  At the time of my evaluation, the patient is alert awake oriented  and in no distress.  Past Medical History:  Diagnosis Date   Abnormal nuclear cardiac imaging test 06/11/2019   Abnormal stress test 07/22/2019   Anemia in chronic kidney disease 12/16/2022   Anemia, pernicious    CAD (coronary artery disease) 05/26/2019   CAD S/P percutaneous coronary angioplasty 07/23/2019   Chronic ischemic heart disease    Chronic kidney disease    Comprehensive diabetic foot examination, type 2 DM, encounter for (HCC) 12/23/2019   Diabetes mellitus due to underlying condition with unspecified complications (HCC) 05/26/2019   Diabetes mellitus without complication (HCC) 05/26/2019   Gammopathy, monoclonal 09/25/2016   Helicobacter heilmannii gastritis    History of melanoma    Hx of CABG 05/26/2019   Hypercalcemia 03/14/2023   Hypertension 12/01/2018   Moderate aortic stenosis 11/16/2021   Obesity    Osteoarthritis    Pain due to onychomycosis of toenail of right foot 01/13/2020   Peroneal tendonitis, left 12/23/2019   Preoperative cardiovascular examination 06/11/2019   Primary osteoarthritis of left foot 12/23/2019   Pure hypercholesterolemia    Renal osteodystrophy 12/01/2018   Stage 3 chronic kidney disease (HCC) 12/01/2018   IMO SNOMED Dx Update Oct 2024     Thrombocytopenia (HCC) 12/16/2022   Type 2 diabetes mellitus with diabetic chronic kidney disease (HCC) 12/01/2018   Vitamin D deficiency     Past Surgical History:  Procedure Laterality Date   ABDOMINAL HYSTERECTOMY     BACK SURGERY  2011   BUNIONECTOMY  BYPASS GRAFT  2000   CATARACT EXTRACTION     CORONARY STENT INTERVENTION N/A 07/22/2019   Procedure: CORONARY STENT INTERVENTION;  Surgeon: Alyssa Candyce RAMAN, MD;  Location: [facility] INVASIVE CV LAB;  Service: Cardiovascular;  Laterality: N/A;   LEFT HEART CATH AND CORS/GRAFTS ANGIOGRAPHY N/A 07/22/2019   Procedure: LEFT HEART CATH AND CORS/GRAFTS ANGIOGRAPHY;  Surgeon: Alyssa Candyce RAMAN, MD;  Location[facility] INVASIVE CV LAB;  Service:  Cardiovascular;  Laterality: N/A;   PARTIAL HYSTERECTOMY     REPLACEMENT TOTAL KNEE      Current Medications: Current Meds  Medication Sig   carvedilol  (COREG ) 25 MG tablet Take 25 mg by mouth 2 (two) times daily.   clopidogrel  (PLAVIX ) 75 MG tablet Take 75 mg by mouth daily.   ergocalciferol (VITAMIN D2) 1.25 MG (50000 UT) capsule Take 50,000 Units by mouth 2 (two) times a week.   liraglutide (VICTOZA) 18 MG/3ML SOPN Inject 1.8 mg into the skin once a week.   nitroGLYCERIN  (NITROSTAT ) 0.4 MG SL tablet Place 1 tablet (0.4 mg total) under the tongue every 5 (five) minutes as needed for chest pain.   ramipril (ALTACE) 10 MG capsule Take 10 mg by mouth daily.   rosuvastatin  (CRESTOR ) 5 MG tablet Take 5 mg by mouth 2 (two) times a week.     Allergies:   Penicillins, Atorvastatin, Ezetimibe, Metformin, Motrin [ibuprofen], Nsaids, Pitavastatin, Tramadol, Bactrim [sulfamethoxazole-trimethoprim], and Furosemide   Social History   Socioeconomic History   Marital status: Widowed    Spouse name: Not on file   Number of children: 3   Years of education: Not on file   Highest education level: 10th grade  Occupational History   Not on file  Tobacco Use   Smoking status: Never   Smokeless tobacco: Never  Vaping Use   Vaping status: Never Used  Substance and Sexual Activity   Alcohol use: Never   Drug use: Never   Sexual activity: Not on file  Other Topics Concern   Not on file  Social History Narrative   Not on file   Social Drivers of Health   Financial Resource Strain: Not on file  Food Insecurity: Not on file  Transportation Needs: Not on file  Physical Activity: Not on file  Stress: Not on file  Social Connections: Not on file     Family History: The patient's family history includes Cancer in her son; Diabetes in her father and sister; Lung cancer in her brother.  ROS:   Please see the history of present illness.    All other systems reviewed and are  negative.  EKGs/Labs/Other Studies Reviewed:    The following studies were reviewed today: .Alyssa Cooper   I discussed my findings with the patient.   Recent Labs: 11/18/2023: ALT 18; BUN 45; Creatinine 1.99; Hemoglobin 11.1; Platelet Count 163; Potassium 4.2; Sodium 143  Recent Lipid Panel    Component Value Date/Time   CHOL 149 05/15/2021 0908   TRIG 105 05/15/2021 0908   HDL 46 05/15/2021 0908   CHOLHDL 3.2 05/15/2021 0908   CHOLHDL 4.5 07/23/2019 0934   VLDL 27 07/23/2019 0934   LDLCALC 84 05/15/2021 0908    Physical Exam:    VS:  BP 116/60   Pulse 62   Ht 5' 3 (1.6 m)   Wt 120 lb 9.6 oz (54.7 kg)   SpO2 98%   BMI 21.36 kg/m     Wt Readings from Last 3 Encounters:  03/04/24 120 lb 9.6 oz (54.7 kg)  11/18/23 121 lb 1.6 oz (54.9 kg)  06/11/23 131 lb 6.4 oz (59.6 kg)     GEN: Patient is in no acute distress HEENT: Normal NECK: No JVD; No carotid bruits LYMPHATICS: No lymphadenopathy CARDIAC: Hear sounds regular, 2/6 systolic murmur at the apex. RESPIRATORY:  Clear to auscultation without rales, wheezing or rhonchi  ABDOMEN: Soft, non-tender, non-distended MUSCULOSKELETAL:  No edema; No deformity  SKIN: Warm and dry NEUROLOGIC:  Alert and oriented x 3 PSYCHIATRIC:  Normal affect   Signed, Alyssa JONELLE Crape, MD  03/04/2024 9:44 AM    [facility]

## 2024-03-04 NOTE — Patient Instructions (Signed)

## 2024-03-08 DIAGNOSIS — N250 Renal osteodystrophy: Secondary | ICD-10-CM | POA: Diagnosis not present

## 2024-03-08 DIAGNOSIS — N184 Chronic kidney disease, stage 4 (severe): Secondary | ICD-10-CM | POA: Diagnosis not present

## 2024-03-08 DIAGNOSIS — I10 Essential (primary) hypertension: Secondary | ICD-10-CM | POA: Diagnosis not present

## 2024-03-10 DIAGNOSIS — S81802A Unspecified open wound, left lower leg, initial encounter: Secondary | ICD-10-CM | POA: Diagnosis not present

## 2024-03-10 DIAGNOSIS — E1165 Type 2 diabetes mellitus with hyperglycemia: Secondary | ICD-10-CM | POA: Diagnosis not present

## 2024-03-10 DIAGNOSIS — S81801A Unspecified open wound, right lower leg, initial encounter: Secondary | ICD-10-CM | POA: Diagnosis not present

## 2024-05-03 DIAGNOSIS — L821 Other seborrheic keratosis: Secondary | ICD-10-CM | POA: Diagnosis not present

## 2024-05-03 DIAGNOSIS — Z85820 Personal history of malignant melanoma of skin: Secondary | ICD-10-CM | POA: Diagnosis not present

## 2024-05-03 DIAGNOSIS — L578 Other skin changes due to chronic exposure to nonionizing radiation: Secondary | ICD-10-CM | POA: Diagnosis not present

## 2024-05-03 DIAGNOSIS — L570 Actinic keratosis: Secondary | ICD-10-CM | POA: Diagnosis not present

## 2024-06-01 DIAGNOSIS — M67912 Unspecified disorder of synovium and tendon, left shoulder: Secondary | ICD-10-CM | POA: Diagnosis not present

## 2024-07-27 DIAGNOSIS — C44729 Squamous cell carcinoma of skin of left lower limb, including hip: Secondary | ICD-10-CM | POA: Diagnosis not present

## 2024-08-11 DIAGNOSIS — C44729 Squamous cell carcinoma of skin of left lower limb, including hip: Secondary | ICD-10-CM | POA: Diagnosis not present

## 2024-11-23 ENCOUNTER — Other Ambulatory Visit

## 2024-11-23 ENCOUNTER — Ambulatory Visit: Admitting: Oncology

## 2024-12-02 ENCOUNTER — Ambulatory Visit: Admitting: Cardiology
